# Patient Record
Sex: Female | Born: 1950 | Hispanic: Yes | Smoking: Former smoker
Health system: Southern US, Community
[De-identification: ages and names within clinical notes are randomized; demographics above are authoritative.]

## PROBLEM LIST (undated history)

## (undated) DIAGNOSIS — E119 Type 2 diabetes mellitus without complications: Secondary | ICD-10-CM

---

## 2016-05-12 ENCOUNTER — Emergency Department
Admission: EM | Admit: 2016-05-12 | Discharge: 2016-05-12 | Disposition: A | Discharge status: Home / Self Care | Payer: Medicare Other | Attending: Emergency Medicine | Admitting: Emergency Medicine

## 2016-05-12 ENCOUNTER — Encounter: Payer: Self-pay | Admitting: *Deleted

## 2016-05-12 DIAGNOSIS — E119 Type 2 diabetes mellitus without complications: Secondary | ICD-10-CM | POA: Diagnosis not present

## 2016-05-12 DIAGNOSIS — R1013 Epigastric pain: Secondary | ICD-10-CM | POA: Diagnosis not present

## 2016-05-12 DIAGNOSIS — F17210 Nicotine dependence, cigarettes, uncomplicated: Secondary | ICD-10-CM | POA: Diagnosis not present

## 2016-05-12 DIAGNOSIS — R110 Nausea: Secondary | ICD-10-CM

## 2016-05-12 DIAGNOSIS — R630 Anorexia: Secondary | ICD-10-CM

## 2016-05-12 DIAGNOSIS — N3000 Acute cystitis without hematuria: Secondary | ICD-10-CM | POA: Diagnosis not present

## 2016-05-12 HISTORY — DX: Type 2 diabetes mellitus without complications: E11.9

## 2016-05-12 LAB — COMPREHENSIVE METABOLIC PANEL
ALK PHOS: 119 U/L (ref 38–126)
ALT: 12 U/L — AB (ref 14–54)
ANION GAP: 9 (ref 5–15)
AST: 21 U/L (ref 15–41)
Albumin: 4 g/dL (ref 3.5–5.0)
BILIRUBIN TOTAL: 0.7 mg/dL (ref 0.3–1.2)
BUN: 15 mg/dL (ref 6–20)
CALCIUM: 9 mg/dL (ref 8.9–10.3)
CO2: 29 mmol/L (ref 22–32)
CREATININE: 0.56 mg/dL (ref 0.44–1.00)
Chloride: 98 mmol/L — ABNORMAL LOW (ref 101–111)
Glucose, Bld: 238 mg/dL — ABNORMAL HIGH (ref 65–99)
Potassium: 4.8 mmol/L (ref 3.5–5.1)
SODIUM: 136 mmol/L (ref 135–145)
TOTAL PROTEIN: 6.8 g/dL (ref 6.5–8.1)

## 2016-05-12 LAB — URINALYSIS, COMPLETE (UACMP) WITH MICROSCOPIC
Bilirubin Urine: NEGATIVE
GLUCOSE, UA: 150 mg/dL — AB
HGB URINE DIPSTICK: NEGATIVE
KETONES UR: 5 mg/dL — AB
LEUKOCYTES UA: NEGATIVE
NITRITE: POSITIVE — AB
PH: 5 (ref 5.0–8.0)
PROTEIN: NEGATIVE mg/dL
RBC / HPF: NONE SEEN RBC/hpf (ref 0–5)
Specific Gravity, Urine: 1.01 (ref 1.005–1.030)
Squamous Epithelial / LPF: NONE SEEN

## 2016-05-12 LAB — CBC
HCT: 41.7 % (ref 35.0–47.0)
HEMOGLOBIN: 14.7 g/dL (ref 12.0–16.0)
MCH: 33.3 pg (ref 26.0–34.0)
MCHC: 35.3 g/dL (ref 32.0–36.0)
MCV: 94.3 fL (ref 80.0–100.0)
PLATELETS: 183 10*3/uL (ref 150–440)
RBC: 4.42 MIL/uL (ref 3.80–5.20)
RDW: 13.3 % (ref 11.5–14.5)
WBC: 6.2 10*3/uL (ref 3.6–11.0)

## 2016-05-12 LAB — TROPONIN I: Troponin I: <0.03 ng/mL (ref <0.03)

## 2016-05-12 LAB — LIPASE, BLOOD: Lipase: 39 U/L (ref 11–51)

## 2016-05-12 LAB — TSH: TSH: 1.089 u[IU]/mL (ref 0.350–4.500)

## 2016-05-12 MED ORDER — ONDANSETRON 4 MG PO TBDP: 4.0000 mg | ORAL_TABLET | Freq: Three times a day (TID) | ORAL | 0 refills | Status: DC | PRN

## 2016-05-12 MED ORDER — CEPHALEXIN 500 MG PO CAPS: 500.0000 mg | ORAL_CAPSULE | Freq: Four times a day (QID) | ORAL | 0 refills | Status: AC

## 2016-05-12 MED ORDER — CEPHALEXIN 500 MG PO CAPS
500.0000 mg | ORAL_CAPSULE | Freq: Once | ORAL | Status: AC
  Administered 2016-05-12: 500 mg via ORAL
  Filled 2016-05-12 (×2): qty 1

## 2016-05-12 NOTE — ED Triage Notes (Signed)
Pt reports abdominal pain for 3 days , pt unable to tolerate food, pt tolerating fluids, interpreter used

## 2016-05-12 NOTE — ED Provider Notes (Signed)
[facility] Emergency Department Provider Note  ____________________________________________  Time seen: Approximately 7:19 PM  I have reviewed the triage vital signs and the nursing notes.   HISTORY  Chief Complaint Abdominal Pain    HPI Carrie Hernandez is a 65 y.o. female a history of diabetes presenting with generalized malaise, fatigue, anorexia, minimal epigastric discomfort, nausea without vomiting. The symptoms been going on for 3 days and have gotten progressively worse. The patient denies any fever, chills, cough or cold symptoms, chest pain, shortness of breath, dysuria.   Past Medical History:  Diagnosis Date  . Diabetes mellitus without complication (HCC)     There are no active problems to display for this patient.   History reviewed. No pertinent surgical history.    Allergies Patient has no known allergies.  No family history on file.  Social History Social History  Substance Use Topics  . Smoking status: Current Every Day Smoker    Packs/day: 0.50    Types: Cigarettes  . Smokeless tobacco: Not on file  . Alcohol use No    Review of Systems Constitutional: No fever/chills.Positive general malaise. Positive general fatigue. Eyes: No visual changes. ENT: No sore throat. No congestion or rhinorrhea. Cardiovascular: Denies chest pain. Denies palpitations. Respiratory: Denies shortness of breath.  No cough. Gastrointestinal: Mild epigastric abdominal pain.  Positive nausea, no vomiting.  Positive anorexia. No diarrhea.  No constipation. Genitourinary: Negative for dysuria. Musculoskeletal: Negative for back pain. Skin: Negative for rash. Neurological: Negative for headaches. No focal numbness, tingling or weakness.   10-point ROS otherwise negative.  ____________________________________________   PHYSICAL EXAM:  VITAL SIGNS: ED Triage Vitals  Enc Vitals Group     BP 05/12/16 1813 112/61     Pulse Rate 05/12/16  1813 94     Resp 05/12/16 1813 18     Temp 05/12/16 1813 98.7 F (37.1 C)     Temp Source 05/12/16 1813 Oral     SpO2 05/12/16 1813 99 %     Weight 05/12/16 1814 90 lb (40.8 kg)     Height 05/12/16 1814 5\' 2"  (1.575 m)     Head Circumference --      Peak Flow --      Pain Score --      Pain Loc --      Pain Edu? --      Excl. in GC? --     Constitutional: Alert and oriented. Chronically ill appearing but in no acute distress. Answers questions appropriately. Eyes: Conjunctivae are normal.  EOMI. No scleral icterus. Head: Atraumatic. Nose: No congestion/rhinnorhea. Mouth/Throat: Mucous membranes are moist.  Neck: No stridor.  Supple.  No JVD. No meningismus. Cardiovascular: Normal rate, regular rhythm. No murmurs, rubs or gallops.  Respiratory: Normal respiratory effort.  No accessory muscle use or retractions. Lungs CTAB.  No wheezes, rales or ronchi. Gastrointestinal: Soft, and nondistended.  Minimal tenderness to palpation in the epigastrium; patient reports it is almost imperceptible. No guarding or rebound.  No peritoneal signs. Musculoskeletal: No LE edema. No ttp in the calves or palpable cords.  Negative Homan's sign. Neurologic:  A&Ox3.  Speech is clear.  Face and smile are symmetric.  EOMI.  Moves all extremities well. Skin:  Skin is warm, dry and intact. No rash noted. Psychiatric: Mood and affect are normal. Speech and behavior are normal.  Normal judgement.  ____________________________________________   LABS (all labs ordered are listed, but only abnormal results are displayed)  Labs Reviewed  COMPREHENSIVE METABOLIC  PANEL - Abnormal; Notable for the following:       Result Value   Chloride 98 (*)    Glucose, Bld 238 (*)    ALT 12 (*)    All other components within normal limits  URINALYSIS, COMPLETE (UACMP) WITH MICROSCOPIC - Abnormal; Notable for the following:    Color, Urine YELLOW (*)    APPearance CLEAR (*)    Glucose, UA 150 (*)    Ketones, ur 5 (*)     Nitrite POSITIVE (*)    Bacteria, UA RARE (*)    All other components within normal limits  LIPASE, BLOOD  CBC  TSH  TROPONIN I   ____________________________________________  EKG  ED ECG REPORT I, Rockne MenghiniNorman, Anne-Caroline, the attending physician, personally viewed and interpreted this ECG.   Date: 05/12/2016  EKG Time: 1817  Rate: 89  Rhythm: normal sinus rhythm  Axis: leftward  Intervals:none  ST&T Change: Nonspecific T-wave inversion in V1. No ST elevation.  ____________________________________________  RADIOLOGY  No results found.  ____________________________________________   PROCEDURES  Procedure(s) performed: None  Procedures  Critical Care performed: No ____________________________________________   INITIAL IMPRESSION / ASSESSMENT AND PLAN / ED COURSE  Pertinent labs & imaging results that were available during my care of the patient were reviewed by me and considered in my medical decision making (see chart for details).  65 y.o. female with a history of diabetes presenting with general malaise, fatigue, anorexia, epigastric discomfort, and nausea. The patient has no focal findings on her physical examination. We'll get a urinalysis to rule out UTI. Her laboratory studies are reassuring although she does have hyperglycemia without DKA. Her EKG does not show ischemic changes, but she is high risk for atypical ACS or MI so we will also get a troponin. Plan to get orthostatics as well. Plan reevaluation for final disposition.  ----------------------------------------- 8:23 PM on 05/12/2016 -----------------------------------------  The patient has reassuring electrolytes with the exception of hyperglycemia. Her white blood cell count is normal and she is not anemic. Her TSH is normal and her troponin is negative. She does not have ischemic changes on her EKG. Her UA is nitrite positive without any other significant the large findings of UTI, but it is  possible that this is the cause of her symptoms. I will initiate her on Keflex, and discharge her home with close PMD follow-up.  ____________________________________________  FINAL CLINICAL IMPRESSION(S) / ED DIAGNOSES  Final diagnoses:  Acute cystitis without hematuria  Epigastric pain  Anorexia  Nausea    Clinical Course       NEW MEDICATIONS STARTED DURING THIS VISIT:  New Prescriptions   CEPHALEXIN (KEFLEX) 500 MG CAPSULE    Take 1 capsule (500 mg total) by mouth 4 (four) times daily.   ONDANSETRON (ZOFRAN ODT) 4 MG DISINTEGRATING TABLET    Take 1 tablet (4 mg total) by mouth every 8 (eight) hours as needed for nausea or vomiting.      Rockne MenghiniAnne-Caroline Deyra Perdomo, MD 05/12/16 2024

## 2016-05-12 NOTE — Discharge Instructions (Signed)
Please take the entire course of antibiotics, even if you're feeling better. Zofran as for nausea. Please return to the emergency department if you develop severe pain, fever, inability to keep down fluids, or any other symptoms concerning to you.

## 2017-12-18 DIAGNOSIS — R030 Elevated blood-pressure reading, without diagnosis of hypertension: Secondary | ICD-10-CM | POA: Insufficient documentation

## 2017-12-18 DIAGNOSIS — E119 Type 2 diabetes mellitus without complications: Secondary | ICD-10-CM | POA: Insufficient documentation

## 2019-10-02 ENCOUNTER — Other Ambulatory Visit: Payer: Self-pay

## 2019-10-02 ENCOUNTER — Emergency Department
Admission: EM | Admit: 2019-10-02 | Discharge: 2019-10-02 | Disposition: A | Discharge status: Home / Self Care | Payer: Medicare Other | Attending: Student | Admitting: Student

## 2019-10-02 DIAGNOSIS — Z794 Long term (current) use of insulin: Secondary | ICD-10-CM | POA: Diagnosis not present

## 2019-10-02 DIAGNOSIS — Z760 Encounter for issue of repeat prescription: Secondary | ICD-10-CM | POA: Diagnosis present

## 2019-10-02 DIAGNOSIS — E119 Type 2 diabetes mellitus without complications: Secondary | ICD-10-CM | POA: Diagnosis not present

## 2019-10-02 MED ORDER — INSULIN GLARGINE 100 UNIT/ML ~~LOC~~ SOLN: SUBCUTANEOUS | 3 refills | Status: AC

## 2019-10-02 NOTE — ED Notes (Addendum)
See triage note  States she is in need of getting her insulin filled  Denies any other problems

## 2019-10-02 NOTE — ED Triage Notes (Signed)
Pt comes via POV from home with c/o needing insulin refill. Pt states she is here visiting family and just need some insulin. Pt states she was not allowed to bring insulin with her on the trip.

## 2019-10-02 NOTE — ED Triage Notes (Signed)
First Nurse Note:  Arrives for insulin RX.  Per family, patient has been out of Insulin since Monday.  Per family, patient returned from [place] on Monday and her insulin was [place] from patient at the airport.  Patient's RX was for  Lantus 7 units q am.  Patient is AAOx3.  Skin warm and dry. NAD

## 2019-10-04 NOTE — ED Provider Notes (Signed)
[facility] Emergency Department Provider Note  ____________________________________________  Time seen: Approximately 10:28 PM  I have reviewed the triage vital signs and the nursing notes.   HISTORY  Chief Complaint insulin refill   HPI Carrie Hernandez is a 69 y.o. female presents to the emergency department requesting a prescription for insulin.  She states that she is here from [place] visiting her family as she was not allowed to bring insulin.  She has no current medical complaints  Past Medical History:  Diagnosis Date  . Diabetes mellitus without complication (HCC)     There are no problems to display for this patient.   History reviewed. No pertinent surgical history.  Prior to Admission medications   Medication Sig Start Date End Date Taking? Authorizing Provider  insulin glargine (LANTUS) 100 UNIT/ML injection 7 units before breakfast 10/02/19 10/01/20  Kem Boroughs B, FNP    Allergies Patient has no known allergies.  No family history on file.  Social History Social History   Tobacco Use  . Smoking status: Current Every Day Smoker    Packs/day: 0.50    Types: Cigarettes  Substance Use Topics  . Alcohol use: No  . Drug use: Not on file    Review of Systems Constitutional: Negative for fever. ENT: Negative for sore throat. Respiratory: Negative for cough Gastrointestinal: No abdominal pain.  No nausea, no vomiting.  No diarrhea.  Musculoskeletal: Negative for generalized body aches. Skin: Negative for rash/lesion/wound. Neurological: Negative for headaches, focal weakness or numbness.  ____________________________________________   PHYSICAL EXAM:  VITAL SIGNS: ED Triage Vitals [10/02/19 1351]  Enc Vitals Group     BP 130/62     Pulse Rate 79     Resp 18     Temp 98 F (36.7 C)     Temp src      SpO2 98 %     Weight 120 lb (54.4 kg)     Height 5' (1.524 m)     Head Circumference      Peak Flow      Pain  Score 0     Pain Loc      Pain Edu?      Excl. in GC?     Constitutional: Alert and oriented. Well appearing and in no acute distress. Eyes: Conjunctivae are normal. PERRL. EOMI. Head: Atraumatic. Nose: No congestion/rhinnorhea. Mouth/Throat: Mucous membranes are moist. Neck: No stridor.  Cardiovascular: Normal rate, regular rhythm. Good peripheral circulation. Respiratory: Normal respiratory effort. Musculoskeletal: Full ROM throughout.  Neurologic:  Normal speech and language. No gross focal neurologic deficits are appreciated. Speech is normal. No gait instability. Skin:  Skin is warm, dry and intact. No rash noted. Psychiatric: Mood and affect are normal. Speech and behavior are normal.  ____________________________________________   LABS (all labs ordered are listed, but only abnormal results are displayed)  Labs Reviewed - No data to display ____________________________________________  EKG  Not indicated ____________________________________________  RADIOLOGY  Not indicated ____________________________________________   PROCEDURES  Not indicated ____________________________________________   INITIAL IMPRESSION / ASSESSMENT AND PLAN / ED COURSE     Pertinent labs & imaging results that were available during my care of the patient were reviewed by me and considered in my medical decision making (see chart for details).  Insulin dosage was verified.  Interpreter was utilized.  Prescription sent to the pharmacy.  Patient was advised follow-up to the emergency department or return to the emergency department for symptoms of concern.. ____________________________________________   FINAL  CLINICAL IMPRESSION(S) / ED DIAGNOSES  Final diagnoses:  Encounter for medication refill       Victorino Dike, FNP 10/04/19 2230    Nance Pear, MD 10/06/19 [phone]

## 2020-01-22 DIAGNOSIS — R413 Other amnesia: Secondary | ICD-10-CM | POA: Insufficient documentation

## 2020-01-27 ENCOUNTER — Other Ambulatory Visit: Payer: Self-pay | Admitting: Acute Care

## 2020-01-27 DIAGNOSIS — I639 Cerebral infarction, unspecified: Secondary | ICD-10-CM

## 2020-02-13 ENCOUNTER — Ambulatory Visit: Admission: RE | Admit: 2020-02-13 | Payer: Medicare Other | Source: Ambulatory Visit

## 2020-04-02 ENCOUNTER — Other Ambulatory Visit: Payer: Self-pay

## 2020-04-02 ENCOUNTER — Ambulatory Visit
Admission: RE | Admit: 2020-04-02 | Discharge: 2020-04-02 | Disposition: A | Discharge status: Home / Self Care | Payer: Medicare Other | Source: Ambulatory Visit | Attending: Acute Care | Admitting: Acute Care

## 2020-04-02 DIAGNOSIS — I639 Cerebral infarction, unspecified: Secondary | ICD-10-CM | POA: Diagnosis present

## 2020-04-08 ENCOUNTER — Other Ambulatory Visit (HOSPITAL_COMMUNITY): Payer: Self-pay | Admitting: Acute Care

## 2020-04-08 ENCOUNTER — Other Ambulatory Visit: Payer: Self-pay | Admitting: Acute Care

## 2020-04-08 DIAGNOSIS — R9089 Other abnormal findings on diagnostic imaging of central nervous system: Secondary | ICD-10-CM

## 2020-04-23 ENCOUNTER — Ambulatory Visit: Admission: RE | Admit: 2020-04-23 | Payer: Medicare Other | Source: Ambulatory Visit

## 2021-01-04 ENCOUNTER — Other Ambulatory Visit (INDEPENDENT_AMBULATORY_CARE_PROVIDER_SITE_OTHER): Payer: Self-pay | Admitting: Nurse Practitioner

## 2021-01-04 DIAGNOSIS — R0989 Other specified symptoms and signs involving the circulatory and respiratory systems: Secondary | ICD-10-CM

## 2021-01-06 ENCOUNTER — Encounter (INDEPENDENT_AMBULATORY_CARE_PROVIDER_SITE_OTHER): Payer: Self-pay | Admitting: Nurse Practitioner

## 2021-01-06 ENCOUNTER — Ambulatory Visit (INDEPENDENT_AMBULATORY_CARE_PROVIDER_SITE_OTHER): Payer: Medicare Other | Admitting: Nurse Practitioner

## 2021-01-06 ENCOUNTER — Ambulatory Visit (INDEPENDENT_AMBULATORY_CARE_PROVIDER_SITE_OTHER): Payer: Medicare Other

## 2021-01-06 ENCOUNTER — Other Ambulatory Visit: Payer: Self-pay

## 2021-01-06 ENCOUNTER — Encounter (INDEPENDENT_AMBULATORY_CARE_PROVIDER_SITE_OTHER): Payer: Self-pay

## 2021-01-06 VITALS — BP 148/77 | HR 90 | Resp 16 | Wt 109.6 lb

## 2021-01-06 DIAGNOSIS — E119 Type 2 diabetes mellitus without complications: Secondary | ICD-10-CM | POA: Diagnosis not present

## 2021-01-06 DIAGNOSIS — R0989 Other specified symptoms and signs involving the circulatory and respiratory systems: Secondary | ICD-10-CM

## 2021-01-06 DIAGNOSIS — I70223 Atherosclerosis of native arteries of extremities with rest pain, bilateral legs: Secondary | ICD-10-CM | POA: Diagnosis not present

## 2021-01-06 NOTE — H&P (View-Only) (Signed)
Subjective:    Patient ID: Carrie Hernandez, female    DOB: 1950/10/20, 70 y.o.   MRN: [id number] Chief Complaint  Patient presents with  . New Patient (Initial Visit)    Ref [name] decreased pulse    Carrie Hernandez is a 70 year old female that presents today on referral by her primary care physician Dr. Letitia Libra with concern to decrease pedal pulses.  The patient also has ingrown toenail on her right foot that may require intervention however due to these decreased pulses they did not want to proceed with any intervention prior to evaluation.  The patient endorses having claudication-like symptoms.  She also complains of feeling like there is pain when she is walking underneath her feet.  She also describes a throbbing pain like sensation when she is in bed in her right foot, that are consistent with rest pain like symptoms.  She currently does not have any open wounds or ulcerations.  The patient does have a previous history of diabetes and she has been smoking as of recently.  Today noninvasive studies show an ABI of 0.0 on the left is 0.58 on the right.  The left TBI 0.42 with a TBI of 0.04 on the right.  The patient has monophasic/biphasic waveforms on the left tibial arteries with slightly dampened toe waveforms.  The right has monophasic waveforms with an absent right toe waveforms.   Review of Systems  Skin:  Negative for wound.  All other systems reviewed and are negative.     Objective:   Physical Exam Vitals reviewed.  HENT:     Head: Normocephalic.  Cardiovascular:     Rate and Rhythm: Normal rate.     Pulses:          Dorsalis pedis pulses are 0 on the right side and detected w/ Doppler on the left side.       Posterior tibial pulses are 0 on the right side and detected w/ Doppler on the left side.  Pulmonary:     Effort: Pulmonary effort is normal.  Skin:    General: Skin is dry.  Neurological:     Mental Status: She is alert and oriented to person, place, and  time.  Psychiatric:        Mood and Affect: Mood normal.        Behavior: Behavior normal.        Thought Content: Thought content normal.        Judgment: Judgment normal.    BP (!) 148/77 (BP Location: Left Arm)   Pulse 90   Resp 16   Wt 109 lb 9.6 oz (49.7 kg)   BMI 21.40 kg/m   Past Medical History:  Diagnosis Date  . Diabetes mellitus without complication [facility])     Social History   Socioeconomic History  . Marital status: Widowed    Spouse name: Not on file  . Number of children: Not on file  . Years of education: Not on file  . Highest education level: Not on file  Occupational History  . Not on file  Tobacco Use  . Smoking status: Former    Packs/day: 0.50    Types: Cigarettes  . Smokeless tobacco: Never  Substance and Sexual Activity  . Alcohol use: No  . Drug use: Not on file  . Sexual activity: Not on file  Other Topics Concern  . Not on file  Social History Narrative  . Not on file   Social Determinants of Health  Financial Resource Strain: Not on file  Food Insecurity: Not on file  Transportation Needs: Not on file  Physical Activity: Not on file  Stress: Not on file  Social Connections: Not on file  Intimate Partner Violence: Not on file    History reviewed. No pertinent surgical history.  Family History  Problem Relation Age of Onset  . Diabetes Mother   . Lung cancer Brother   . Heart attack Brother   . Diabetes Maternal Aunt   . Diabetes Maternal Uncle     No Known Allergies  CBC Latest Ref Rng & Units 05/12/2016  WBC 3.6 - 11.0 K/uL 6.2  Hemoglobin 12.0 - 16.0 g/dL 81.4  Hematocrit 48.1 - 47.0 % 41.7  Platelets 150 - 440 K/uL 183      CMP     Component Value Date/Time   NA 136 05/12/2016 1816   K 4.8 05/12/2016 1816   CL 98 (L) 05/12/2016 1816   CO2 29 05/12/2016 1816   GLUCOSE 238 (H) 05/12/2016 1816   BUN 15 05/12/2016 1816   CREATININE 0.56 05/12/2016 1816   CALCIUM 9.0 05/12/2016 1816   PROT 6.8 05/12/2016  1816   ALBUMIN 4.0 05/12/2016 1816   AST 21 05/12/2016 1816   ALT 12 (L) 05/12/2016 1816   ALKPHOS 119 05/12/2016 1816   BILITOT 0.7 05/12/2016 1816   GFRNONAA >60 05/12/2016 1816   GFRAA >60 05/12/2016 1816     No results found.     Assessment & Plan:   1. 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  Currently if the patient undergoes any procedures for an ingrown toenail on her right foot it would not heal due to profound decrease in perfusion.  Recommend:  The patient has evidence of severe atherosclerotic changes of the right lower extremity with rest pain that is associated with preulcerative changes and impending tissue loss of the foot.  This represents a limb threatening ischemia and places the patient at risk for limb loss.  Patient should undergo angiography of the lower extremities with the hope for intervention for limb salvage.  The risks and benefits as well as the alternative therapies was discussed in detail with the patient.  All questions were answered.  Patient agrees to proceed with angiography.  The patient will follow up with me in the office after the procedure.       2. Type 2 diabetes mellitus without complication, without long-term current use of insulin (HCC) Continue hypoglycemic medications as already ordered, these medications have been reviewed and there are no changes at this time.  Hgb A1C to be monitored as already arranged by primary service    Current Outpatient Medications on File Prior to Visit  Medication Sig Dispense Refill  . BD INSULIN SYRINGE U/F 31G X 5/16" 0.3 ML MISC See admin instructions.    Marland Kitchen glucose blood (PRECISION QID TEST) test strip 1 each (1 strip total) 3 (three) times daily To check blood sugars Dx: E11.9    . insulin glargine (LANTUS) 100 UNIT/ML injection 7 units  before breakfast 10 mL 3  . metFORMIN (GLUCOPHAGE) 500 MG tablet Take by mouth.    . traZODone (DESYREL) 50 MG tablet Take 50 mg by mouth at bedtime.     No current facility-administered medications on file prior to visit.    There are no  Patient Instructions on file for this visit. No follow-ups on file.   Georgiana Spinner, NP

## 2021-01-06 NOTE — Progress Notes (Signed)
Subjective:    Patient ID: Carrie Hernandez, female    DOB: 1950/10/20, 70 y.o.   MRN: [id number] Chief Complaint  Patient presents with  . New Patient (Initial Visit)    Ref [name] decreased pulse    Carrie Hernandez is a 70 year old female that presents today on referral by her primary care physician Dr. Letitia Libra with concern to decrease pedal pulses.  The patient also has ingrown toenail on her right foot that may require intervention however due to these decreased pulses they did not want to proceed with any intervention prior to evaluation.  The patient endorses having claudication-like symptoms.  She also complains of feeling like there is pain when she is walking underneath her feet.  She also describes a throbbing pain like sensation when she is in bed in her right foot, that are consistent with rest pain like symptoms.  She currently does not have any open wounds or ulcerations.  The patient does have a previous history of diabetes and she has been smoking as of recently.  Today noninvasive studies show an ABI of 0.0 on the left is 0.58 on the right.  The left TBI 0.42 with a TBI of 0.04 on the right.  The patient has monophasic/biphasic waveforms on the left tibial arteries with slightly dampened toe waveforms.  The right has monophasic waveforms with an absent right toe waveforms.   Review of Systems  Skin:  Negative for wound.  All other systems reviewed and are negative.     Objective:   Physical Exam Vitals reviewed.  HENT:     Head: Normocephalic.  Cardiovascular:     Rate and Rhythm: Normal rate.     Pulses:          Dorsalis pedis pulses are 0 on the right side and detected w/ Doppler on the left side.       Posterior tibial pulses are 0 on the right side and detected w/ Doppler on the left side.  Pulmonary:     Effort: Pulmonary effort is normal.  Skin:    General: Skin is dry.  Neurological:     Mental Status: She is alert and oriented to person, place, and  time.  Psychiatric:        Mood and Affect: Mood normal.        Behavior: Behavior normal.        Thought Content: Thought content normal.        Judgment: Judgment normal.    BP (!) 148/77 (BP Location: Left Arm)   Pulse 90   Resp 16   Wt 109 lb 9.6 oz (49.7 kg)   BMI 21.40 kg/m   Past Medical History:  Diagnosis Date  . Diabetes mellitus without complication [facility])     Social History   Socioeconomic History  . Marital status: Widowed    Spouse name: Not on file  . Number of children: Not on file  . Years of education: Not on file  . Highest education level: Not on file  Occupational History  . Not on file  Tobacco Use  . Smoking status: Former    Packs/day: 0.50    Types: Cigarettes  . Smokeless tobacco: Never  Substance and Sexual Activity  . Alcohol use: No  . Drug use: Not on file  . Sexual activity: Not on file  Other Topics Concern  . Not on file  Social History Narrative  . Not on file   Social Determinants of Health  Financial Resource Strain: Not on file  Food Insecurity: Not on file  Transportation Needs: Not on file  Physical Activity: Not on file  Stress: Not on file  Social Connections: Not on file  Intimate Partner Violence: Not on file    History reviewed. No pertinent surgical history.  Family History  Problem Relation Age of Onset  . Diabetes Mother   . Lung cancer Brother   . Heart attack Brother   . Diabetes Maternal Aunt   . Diabetes Maternal Uncle     No Known Allergies  CBC Latest Ref Rng & Units 05/12/2016  WBC 3.6 - 11.0 K/uL 6.2  Hemoglobin 12.0 - 16.0 g/dL 81.4  Hematocrit 48.1 - 47.0 % 41.7  Platelets 150 - 440 K/uL 183      CMP     Component Value Date/Time   NA 136 05/12/2016 1816   K 4.8 05/12/2016 1816   CL 98 (L) 05/12/2016 1816   CO2 29 05/12/2016 1816   GLUCOSE 238 (H) 05/12/2016 1816   BUN 15 05/12/2016 1816   CREATININE 0.56 05/12/2016 1816   CALCIUM 9.0 05/12/2016 1816   PROT 6.8 05/12/2016  1816   ALBUMIN 4.0 05/12/2016 1816   AST 21 05/12/2016 1816   ALT 12 (L) 05/12/2016 1816   ALKPHOS 119 05/12/2016 1816   BILITOT 0.7 05/12/2016 1816   GFRNONAA >60 05/12/2016 1816   GFRAA >60 05/12/2016 1816     No results found.     Assessment & Plan:   1. 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  Currently if the patient undergoes any procedures for an ingrown toenail on her right foot it would not heal due to profound decrease in perfusion.  Recommend:  The patient has evidence of severe atherosclerotic changes of the right lower extremity with rest pain that is associated with preulcerative changes and impending tissue loss of the foot.  This represents a limb threatening ischemia and places the patient at risk for limb loss.  Patient should undergo angiography of the lower extremities with the hope for intervention for limb salvage.  The risks and benefits as well as the alternative therapies was discussed in detail with the patient.  All questions were answered.  Patient agrees to proceed with angiography.  The patient will follow up with me in the office after the procedure.       2. Type 2 diabetes mellitus without complication, without long-term current use of insulin (HCC) Continue hypoglycemic medications as already ordered, these medications have been reviewed and there are no changes at this time.  Hgb A1C to be monitored as already arranged by primary service    Current Outpatient Medications on File Prior to Visit  Medication Sig Dispense Refill  . BD INSULIN SYRINGE U/F 31G X 5/16" 0.3 ML MISC See admin instructions.    Marland Kitchen glucose blood (PRECISION QID TEST) test strip 1 each (1 strip total) 3 (three) times daily To check blood sugars Dx: E11.9    . insulin glargine (LANTUS) 100 UNIT/ML injection 7 units  before breakfast 10 mL 3  . metFORMIN (GLUCOPHAGE) 500 MG tablet Take by mouth.    . traZODone (DESYREL) 50 MG tablet Take 50 mg by mouth at bedtime.     No current facility-administered medications on file prior to visit.    There are no  Patient Instructions on file for this visit. No follow-ups on file.   Georgiana Spinner, NP

## 2021-01-11 ENCOUNTER — Ambulatory Visit
Admission: RE | Admit: 2021-01-11 | Discharge: 2021-01-11 | Disposition: A | Discharge status: Home / Self Care | Payer: Medicare Other | Attending: Vascular Surgery | Admitting: Vascular Surgery

## 2021-01-11 ENCOUNTER — Other Ambulatory Visit (INDEPENDENT_AMBULATORY_CARE_PROVIDER_SITE_OTHER): Payer: Self-pay | Admitting: Nurse Practitioner

## 2021-01-11 ENCOUNTER — Encounter
Admission: RE | Disposition: A | Discharge status: Home / Self Care | Payer: Self-pay | Source: Home / Self Care | Attending: Vascular Surgery

## 2021-01-11 DIAGNOSIS — I70223 Atherosclerosis of native arteries of extremities with rest pain, bilateral legs: Secondary | ICD-10-CM | POA: Diagnosis not present

## 2021-01-11 DIAGNOSIS — I70221 Atherosclerosis of native arteries of extremities with rest pain, right leg: Secondary | ICD-10-CM | POA: Insufficient documentation

## 2021-01-11 DIAGNOSIS — Z7984 Long term (current) use of oral hypoglycemic drugs: Secondary | ICD-10-CM | POA: Insufficient documentation

## 2021-01-11 DIAGNOSIS — Z794 Long term (current) use of insulin: Secondary | ICD-10-CM | POA: Insufficient documentation

## 2021-01-11 DIAGNOSIS — Z833 Family history of diabetes mellitus: Secondary | ICD-10-CM | POA: Insufficient documentation

## 2021-01-11 DIAGNOSIS — I70229 Atherosclerosis of native arteries of extremities with rest pain, unspecified extremity: Secondary | ICD-10-CM

## 2021-01-11 DIAGNOSIS — E1151 Type 2 diabetes mellitus with diabetic peripheral angiopathy without gangrene: Secondary | ICD-10-CM | POA: Diagnosis present

## 2021-01-11 DIAGNOSIS — Z87891 Personal history of nicotine dependence: Secondary | ICD-10-CM | POA: Insufficient documentation

## 2021-01-11 DIAGNOSIS — L600 Ingrowing nail: Secondary | ICD-10-CM | POA: Insufficient documentation

## 2021-01-11 HISTORY — PX: LOWER EXTREMITY ANGIOGRAPHY: CATH118251

## 2021-01-11 LAB — CREATININE, SERUM
Creatinine, Ser: 0.65 mg/dL (ref 0.44–1.00)
GFR, Estimated: >60 mL/min (ref >60)

## 2021-01-11 LAB — GLUCOSE, CAPILLARY
Glucose-Capillary: 268 mg/dL — ABNORMAL HIGH (ref 70–99)
Glucose-Capillary: 82 mg/dL (ref 70–99)
Glucose-Capillary: 130 mg/dL — ABNORMAL HIGH (ref 70–99)

## 2021-01-11 LAB — BUN: BUN: 14 mg/dL (ref 8–23)

## 2021-01-11 SURGERY — LOWER EXTREMITY ANGIOGRAPHY
Anesthesia: Moderate Sedation | Site: Leg Lower | Laterality: Right

## 2021-01-11 MED ORDER — METHYLPREDNISOLONE SODIUM SUCC 125 MG IJ SOLR: 125.0000 mg | Freq: Once | INTRAMUSCULAR | Status: DC | PRN

## 2021-01-11 MED ORDER — CEFAZOLIN SODIUM-DEXTROSE 2-4 GM/100ML-% IV SOLN
2.0000 g | Freq: Once | INTRAVENOUS | Status: AC
  Administered 2021-01-11: 2 g via INTRAVENOUS

## 2021-01-11 MED ORDER — DEXTROSE-NACL 5-0.45 % IV SOLN: INTRAVENOUS | Status: DC

## 2021-01-11 MED ORDER — CLOPIDOGREL BISULFATE 75 MG PO TABS: 75.0000 mg | ORAL_TABLET | Freq: Every day | ORAL | 11 refills | Status: DC

## 2021-01-11 MED ORDER — SODIUM CHLORIDE 0.9 % IV SOLN: INTRAVENOUS | Status: DC

## 2021-01-11 MED ORDER — LABETALOL HCL 5 MG/ML IV SOLN: 10.0000 mg | INTRAVENOUS | Status: DC | PRN

## 2021-01-11 MED ORDER — SODIUM CHLORIDE 0.9 % IV SOLN: 250.0000 mL | INTRAVENOUS | Status: DC | PRN

## 2021-01-11 MED ORDER — ATORVASTATIN CALCIUM 10 MG PO TABS: 10.0000 mg | ORAL_TABLET | Freq: Every day | ORAL | 11 refills | Status: AC

## 2021-01-11 MED ORDER — CLOPIDOGREL BISULFATE 75 MG PO TABS: 75.0000 mg | ORAL_TABLET | Freq: Every day | ORAL | Status: DC

## 2021-01-11 MED ORDER — ONDANSETRON HCL 4 MG/2ML IJ SOLN: 4.0000 mg | Freq: Four times a day (QID) | INTRAMUSCULAR | Status: DC | PRN

## 2021-01-11 MED ORDER — HEPARIN SODIUM (PORCINE) 1000 UNIT/ML IJ SOLN
INTRAMUSCULAR | Status: DC | PRN
  Administered 2021-01-11: 4000 [IU] via INTRAVENOUS

## 2021-01-11 MED ORDER — IODIXANOL 320 MG/ML IV SOLN
INTRAVENOUS | Status: DC | PRN
  Administered 2021-01-11: 65 mL

## 2021-01-11 MED ORDER — SODIUM CHLORIDE 0.9% FLUSH: 3.0000 mL | Freq: Two times a day (BID) | INTRAVENOUS | Status: DC

## 2021-01-11 MED ORDER — MIDAZOLAM HCL 5 MG/5ML IJ SOLN
INTRAMUSCULAR | Status: AC
  Filled 2021-01-11: qty 5

## 2021-01-11 MED ORDER — MIDAZOLAM HCL 2 MG/2ML IJ SOLN
INTRAMUSCULAR | Status: DC | PRN
  Administered 2021-01-11: 1 mg via INTRAVENOUS
  Administered 2021-01-11: 2 mg via INTRAVENOUS

## 2021-01-11 MED ORDER — HYDROMORPHONE HCL 1 MG/ML IJ SOLN: 1.0000 mg | Freq: Once | INTRAMUSCULAR | Status: DC | PRN

## 2021-01-11 MED ORDER — MIDAZOLAM HCL 2 MG/ML PO SYRP: 8.0000 mg | ORAL_SOLUTION | Freq: Once | ORAL | Status: DC | PRN

## 2021-01-11 MED ORDER — ASPIRIN EC 81 MG PO TBEC
81.0000 mg | DELAYED_RELEASE_TABLET | Freq: Every day | ORAL | Status: DC
  Administered 2021-01-11: 81 mg via ORAL

## 2021-01-11 MED ORDER — ACETAMINOPHEN 325 MG PO TABS: 650.0000 mg | ORAL_TABLET | ORAL | Status: DC | PRN

## 2021-01-11 MED ORDER — HEPARIN SODIUM (PORCINE) 1000 UNIT/ML IJ SOLN
INTRAMUSCULAR | Status: AC
  Filled 2021-01-11: qty 1

## 2021-01-11 MED ORDER — ATORVASTATIN CALCIUM 10 MG PO TABS
10.0000 mg | ORAL_TABLET | Freq: Every day | ORAL | Status: DC
  Filled 2021-01-11 (×2): qty 1

## 2021-01-11 MED ORDER — SODIUM CHLORIDE 0.9% FLUSH: 3.0000 mL | INTRAVENOUS | Status: DC | PRN

## 2021-01-11 MED ORDER — FENTANYL CITRATE (PF) 100 MCG/2ML IJ SOLN
INTRAMUSCULAR | Status: DC | PRN
  Administered 2021-01-11: 50 ug via INTRAVENOUS
  Administered 2021-01-11: 25 ug via INTRAVENOUS

## 2021-01-11 MED ORDER — ASPIRIN EC 81 MG PO TBEC
DELAYED_RELEASE_TABLET | ORAL | Status: AC
  Filled 2021-01-11: qty 1

## 2021-01-11 MED ORDER — FENTANYL CITRATE (PF) 100 MCG/2ML IJ SOLN
INTRAMUSCULAR | Status: AC
  Filled 2021-01-11: qty 2

## 2021-01-11 MED ORDER — FAMOTIDINE 20 MG PO TABS: 40.0000 mg | ORAL_TABLET | Freq: Once | ORAL | Status: DC | PRN

## 2021-01-11 MED ORDER — DIPHENHYDRAMINE HCL 50 MG/ML IJ SOLN: 50.0000 mg | Freq: Once | INTRAMUSCULAR | Status: DC | PRN

## 2021-01-11 MED ORDER — HYDRALAZINE HCL 20 MG/ML IJ SOLN: 5.0000 mg | INTRAMUSCULAR | Status: DC | PRN

## 2021-01-11 MED ORDER — ASPIRIN EC 81 MG PO TBEC: 81.0000 mg | DELAYED_RELEASE_TABLET | Freq: Every day | ORAL | 2 refills | Status: AC

## 2021-01-11 SURGICAL SUPPLY — 27 items
BALLN LUTONIX 018 4X100X130 (BALLOONS) ×2
BALLN LUTONIX 018 4X150X130 (BALLOONS) ×2
BALLN LUTONIX 018 4X220X130 (BALLOONS) ×2
BALLN ULTRV 018 3X100X75 (BALLOONS)
BALLN ULTRVRSE 2X220X150 (BALLOONS) ×2
BALLN ULTRVRSE 3X100X150 (BALLOONS) ×2
BALLOON LUTONIX 018 4X100X130 (BALLOONS) ×1 IMPLANT
BALLOON LUTONIX 018 4X150X130 (BALLOONS) ×1 IMPLANT
BALLOON LUTONIX 018 4X220X130 (BALLOONS) ×1 IMPLANT
BALLOON ULTRV 018 3X100X75 (BALLOONS) IMPLANT
BALLOON ULTRVRSE 2X220X150 (BALLOONS) ×1 IMPLANT
BALLOON ULTRVRSE 3X100X150 (BALLOONS) ×1 IMPLANT
CATH PIG 70CM (CATHETERS) ×2 IMPLANT
CATH VERT 5X100 (CATHETERS) ×2 IMPLANT
COVER PROBE U/S 5X48 (MISCELLANEOUS) ×2 IMPLANT
DEVICE STARCLOSE SE CLOSURE (Vascular Products) ×2 IMPLANT
GLIDEWIRE ADV .035X260CM (WIRE) ×2 IMPLANT
KIT ENCORE 26 ADVANTAGE (KITS) ×2 IMPLANT
PACK ANGIOGRAPHY (CUSTOM PROCEDURE TRAY) ×2 IMPLANT
SHEATH ANL2 6FRX45 HC (SHEATH) ×2 IMPLANT
SHEATH BRITE TIP 5FRX11 (SHEATH) ×2 IMPLANT
STENT VIABAHN 5X100X120 (Permanent Stent) ×2 IMPLANT
STENT VIABAHN 5X150X120 (Permanent Stent) ×2 IMPLANT
SYR MEDRAD MARK 7 150ML (SYRINGE) ×2 IMPLANT
TUBING CONTRAST HIGH PRESS 72 (TUBING) ×2 IMPLANT
WIRE G V18X300CM (WIRE) ×2 IMPLANT
WIRE GUIDERIGHT .035X150 (WIRE) ×2 IMPLANT

## 2021-01-11 NOTE — Progress Notes (Signed)
Pt. BG: 82 upon readmission to [facility]. Pt. Has remained NPO since 0700. Pt. And daughter Hollie Salk state "she becomes sweaty and weak if she doesn't eat every 2 hrs." Dr. Wyn Quaker made aware: New IV ordered for procedure. Pt. Clammy. VSS. Pt. States she is "warm" per interpretor. Stable for procedure.

## 2021-01-11 NOTE — Interval H&P Note (Signed)
History and Physical Interval Note:  01/11/2021 1:05 PM  Carrie Hernandez  has presented today for surgery, with the diagnosis of RLE Angio    ASO w rest pain  Spanish Interpreter Requested.  The various methods of treatment have been discussed with the patient and family. After consideration of risks, benefits and other options for treatment, the patient has consented to  Procedure(s): LOWER EXTREMITY ANGIOGRAPHY (Right) as a surgical intervention.  The patient's history has been reviewed, patient examined, no change in status, stable for surgery.  I have reviewed the patient's chart and labs.  Questions were answered to the patient's satisfaction.     Festus Barren

## 2021-01-11 NOTE — Op Note (Signed)
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  Ultrasound guidance for vascular access left femoral artery             2.  Catheter placement into right common femoral artery from left femoral approach             3.  Aortogram and selective right lower extremity angiogram             4.  Percutaneous transluminal angioplasty of right posterior tibial artery with 2 mm diameter angioplasty balloon and of the tibioperoneal trunk with 3 mm diameter angioplasty balloon             5.  Percutaneous transluminal angioplasty of both the proximal right SFA as well as the distal right SFA and above-knee popliteal arteries with 4 mm diameter Lutonix drug-coated angioplasty balloons  6.  Stent placement to the right popliteal artery most distal SFA with 5 mm diameter by 15 cm length Viabahn stent  7.  Stent placement to the right proximal SFA with 5 mm diameter by 10 cm length Viabahn stent             8.  StarClose closure device left femoral artery  EBL: 10 cc  Contrast: 65 cc  Fluoro Time: 7.5 minutes  Moderate Conscious Sedation Time: approximately 63 minutes using 3 mg of Versed and 75 mcg of Fentanyl              Indications:  Patient is a 70 y.o.female with rest pain symptoms in both lower extremities. The patient has noninvasive study showing markedly reduced ABIs bilaterally. The patient is brought in for angiography for further evaluation and potential treatment.  Due to the limb threatening nature of the situation, angiogram was performed for attempted limb salvage. The patient is aware that if the procedure fails, amputation would be expected.  The patient also understands that even with successful revascularization, amputation may  still be required due to the severity of the situation.  Risks and benefits are discussed and informed consent is obtained.   Procedure:  The patient was identified and appropriate procedural time out was performed.  The patient was then placed supine on the table and prepped and draped in the usual sterile fashion. Moderate conscious sedation was administered during a face to face encounter with the patient throughout the procedure with my supervision of the RN administering medicines and monitoring the patient's vital signs, pulse oximetry, telemetry and mental status throughout from the start of the procedure until the patient was taken to the recovery room. Ultrasound was used to evaluate the left common femoral artery.  It was patent .  A digital ultrasound image was acquired.  A Seldinger needle was used to access the left common femoral artery under direct ultrasound guidance and a permanent image was performed.  A 0.035 J wire was advanced without resistance and a 5Fr sheath was placed.  Pigtail catheter was placed into the aorta and an AP aortogram was performed. This demonstrated normal renal arteries.  The aorta was calcific but not stenotic.  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  This was near the origin of the common iliac artery on the  left and was in the 70 to 75% range.  Both external iliac arteries appear to be widely patent.  I then crossed the aortic bifurcation and advanced to the right femoral head. Selective right lower extremity angiogram was then performed. This demonstrated 2 areas of stenosis in the proximal right superficial femoral artery in the 70 to 80% range.  The vessel then normalized down to Hunter's canal where the above-knee popliteal artery occluded and there was occlusion of all 3 tibial vessels with reconstitution of both the anterior tibial and the posterior tibial arteries in the proximal to mid segment.  These were then  continuous distally. It was felt that it was in the patient's best interest to proceed with intervention after these images to avoid a second procedure and a larger amount of contrast and fluoroscopy based off of the findings from the initial angiogram. The patient was systemically heparinized and a 6 [place] Ansell sheath was then placed over the [employer] wire. I then used a Kumpe catheter and the advantage wire to navigate down through the SFA stenosis and then across the popliteal occlusion and proximal posterior tibial artery occlusion getting down into the proximal to mid posterior tibial artery and confirming intraluminal flow.  I then exchanged for a V 18 wire.  A 2 mm diameter by 22 cm length angioplasty balloon was then inflated from the mid posterior tibial artery up to the tibioperoneal trunk.  A 3 mm diameter by 10 cm length angioplasty balloon was then inflated in the distal popliteal artery and tibioperoneal trunk and taken up to 6 atm for 1 minute.  A 4 mm diameter by 22 cm length Lutonix drug-coated angioplasty balloon was then used to treat the popliteal artery in the distal SFA inflating this to 8 atm for 1 minute and a 4 mm diameter by 10 cm length Lutonix drug-coated angioplasty balloon was inflated to 8 atm for 1 minute in the proximal SFA.  The posterior tibial artery and tibioperoneal trunk had less than 25% residual stenosis.  There is greater than 50% residual stenosis both in the popliteal artery and the proximal SFA that were addressed with stents.  A 5 mm diameter by 15 cm length Viabahn stent was deployed in the popliteal artery most distal SFA and a 5 mm diameter by 10 cm length stent was deployed in the proximal SFA.  These were postdilated with 4 mm balloons with excellent angiographic ablation result and less than 10% residual stenosis.  The left common iliac lesion was going to require kissing stents and I did not want to do this prior to her intervention on the left leg  which would make this for more difficult, so I elected to defer treatment on this until another day after her left infrainguinal disease was addressed. I elected to terminate the procedure. The sheath was removed and StarClose closure device was deployed in the right femoral artery with excellent hemostatic result. The patient was taken to the recovery room in stable condition having tolerated the procedure well.  Findings:               Aortogram: Renal arteries appeared widely patent.  The aorta was calcific but not stenotic.  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  This was near the origin of the common iliac artery on the left and was in the 70 to 75% range.  Both external iliac arteries appear to be widely patent.  Right lower Extremity:  This demonstrated 2 areas of stenosis in the proximal right superficial femoral artery in the 70 to 80% range.  The vessel then normalized down to Hunter's canal where the above-knee popliteal artery occluded and there was occlusion of all 3 tibial vessels with reconstitution of both the anterior tibial and the posterior tibial arteries in the proximal to mid segment.  These were then continuous distally.   Disposition: Patient was taken to the recovery room in stable condition having tolerated the procedure well.  Complications: None  Leotis Pain 01/11/2021 4:00 PM   This note was created with Dragon Medical transcription system. Any errors in dictation are purely unintentional.

## 2021-01-11 NOTE — Progress Notes (Signed)
Pt. Arrived late for procedure : pt. Interpretor Orson Slick here for [place] translation. Pt. States she had coffee with splenda & milk and a plain piece of toast at 7 AM as she was worried "about her sugar dropping." BG now: 268. Spoke with Dr. Wyn Quaker. Pt. To remain NPO and  come back at 2 PM to do procedure. Pt. Able to walk self out with relative. Stable for DC home at present.

## 2021-01-12 ENCOUNTER — Encounter: Payer: Self-pay | Admitting: Vascular Surgery

## 2021-01-18 ENCOUNTER — Telehealth (INDEPENDENT_AMBULATORY_CARE_PROVIDER_SITE_OTHER): Payer: Self-pay | Admitting: Nurse Practitioner

## 2021-01-18 NOTE — Telephone Encounter (Signed)
Patient is stating she is having pain with right ankle. Patient did have lower extre. Angio on 01/11/2021.  Patient is noticing pain in the area with warmness to the touch, she also states red dots are popping up in the area.  She is wanting to know if she can be seen sooner then Sept appointment.  Please advise  She would like her daughter to be called: Myra [phone]

## 2021-01-18 NOTE — Telephone Encounter (Signed)
The patient is likely experiencing some reperfusion pain and symptoms that happen after angiogram.  This is not unusual.  Patients can experience pain, swelling, and warmth following the procedure. The red dots are a side effect of the swelling. What is worrisome to [place] is if her foot becomes cold or extremely painful. She should use compression socks and elevate her leg and take tylenol for the pain.  She should try these interventions before we bring her in. If the patient has worsening swelling or pain, we can move her up at that time

## 2021-01-19 NOTE — Telephone Encounter (Signed)
Left message on patient daughter voicemail to return call

## 2021-01-19 NOTE — Telephone Encounter (Signed)
Lvm on daughter Carrie Hernandez's vm to callback at her earliest convenience to make her aware of Np's note.  This note is for documentation purposes only.

## 2021-01-21 NOTE — Telephone Encounter (Signed)
VM was left on nurses line by interpreter that patient reached out to them to let us know that she is having issues with her leg. Patient had le angio done 01/11/21. I made patients granddaughter aware that symptoms patient is having are normal per NP's not and if symptoms worsen to give [place] a call to have patient evaluated. Granddaughter made aware and will relay to patient

## 2021-01-27 DIAGNOSIS — I70229 Atherosclerosis of native arteries of extremities with rest pain, unspecified extremity: Secondary | ICD-10-CM | POA: Insufficient documentation

## 2021-02-09 ENCOUNTER — Other Ambulatory Visit (INDEPENDENT_AMBULATORY_CARE_PROVIDER_SITE_OTHER): Payer: Self-pay | Admitting: Vascular Surgery

## 2021-02-09 DIAGNOSIS — I70223 Atherosclerosis of native arteries of extremities with rest pain, bilateral legs: Secondary | ICD-10-CM

## 2021-02-09 DIAGNOSIS — Z95820 Peripheral vascular angioplasty status with implants and grafts: Secondary | ICD-10-CM

## 2021-02-10 ENCOUNTER — Encounter (INDEPENDENT_AMBULATORY_CARE_PROVIDER_SITE_OTHER): Payer: Self-pay | Admitting: Nurse Practitioner

## 2021-02-10 ENCOUNTER — Other Ambulatory Visit: Payer: Self-pay

## 2021-02-10 ENCOUNTER — Ambulatory Visit (INDEPENDENT_AMBULATORY_CARE_PROVIDER_SITE_OTHER): Payer: Medicare Other

## 2021-02-10 ENCOUNTER — Ambulatory Visit (INDEPENDENT_AMBULATORY_CARE_PROVIDER_SITE_OTHER): Payer: Medicare Other | Admitting: Nurse Practitioner

## 2021-02-10 VITALS — BP 145/73 | HR 86 | Resp 16 | Wt 111.0 lb

## 2021-02-10 DIAGNOSIS — E119 Type 2 diabetes mellitus without complications: Secondary | ICD-10-CM | POA: Diagnosis not present

## 2021-02-10 DIAGNOSIS — I70223 Atherosclerosis of native arteries of extremities with rest pain, bilateral legs: Secondary | ICD-10-CM

## 2021-02-10 DIAGNOSIS — Z95820 Peripheral vascular angioplasty status with implants and grafts: Secondary | ICD-10-CM | POA: Diagnosis not present

## 2021-02-15 ENCOUNTER — Encounter (INDEPENDENT_AMBULATORY_CARE_PROVIDER_SITE_OTHER): Payer: Self-pay | Admitting: Nurse Practitioner

## 2021-02-15 NOTE — Progress Notes (Signed)
Subjective:    Patient ID: Carrie Hernandez, female    DOB: [birth year], 70 y.o.   MRN: [id number] Chief Complaint  Patient presents with   Follow-up    [facility] 1 month le angio    Lalitha Ilyas is a 70 year old female that returns to the office for followup and review status post angiogram with intervention. The patient notes improvement in the lower extremity symptoms. No interval shortening of the patient's claudication distance or rest pain symptoms. No new ulcers or wounds have occurred since the last visit.  Patient had reperfusion edema but this is resolved at this time.  There have been no significant changes to the patient's overall health care.  The patient denies amaurosis fugax or recent TIA symptoms. There are no recent neurological changes noted. The patient denies history of DVT, PE or superficial thrombophlebitis. The patient denies recent episodes of angina or shortness of breath.   ABI's Rt=1.25 and Lt=0.84  (previous ABI's Rt=0.58 and Lt=0.80) Duplex US of the bilateral tibial arteries reveals biphasic waveforms with strong right first digit waveforms and slightly dampened left first digit waveforms.   Review of Systems  Cardiovascular:  Positive for leg swelling.  Skin:  Negative for wound.  All other systems reviewed and are negative.     Objective:   Physical Exam Vitals reviewed.  HENT:     Head: Normocephalic.  Cardiovascular:     Rate and Rhythm: Normal rate.     Pulses:          Dorsalis pedis pulses are 1+ on the right side and 1+ on the left side.       Posterior tibial pulses are 1+ on the right side.  Pulmonary:     Effort: Pulmonary effort is normal.  Musculoskeletal:     Right lower leg: No edema.     Left lower leg: No edema.  Neurological:     Mental Status: She is alert and oriented to person, place, and time.  Psychiatric:        Mood and Affect: Mood normal.        Behavior: Behavior normal.        Thought Content: Thought content  normal.        Judgment: Judgment normal.    BP (!) 145/73 (BP Location: Left Arm)   Pulse 86   Resp 16   Wt 111 lb (50.3 kg)   BMI 20.97 kg/m   Past Medical History:  Diagnosis Date   Diabetes mellitus without complication (HCC)     Social History   Socioeconomic History   Marital status: Widowed    Spouse name: Not on file   Number of children: Not on file   Years of education: Not on file   Highest education level: Not on file  Occupational History   Not on file  Tobacco Use   Smoking status: Former    Packs/day: 0.50    Types: Cigarettes   Smokeless tobacco: Never  Substance and Sexual Activity   Alcohol use: No   Drug use: Not on file   Sexual activity: Not on file  Other Topics Concern   Not on file  Social History Narrative   Not on file   Social Determinants of Health   Financial Resource Strain: Not on file  Food Insecurity: Not on file  Transportation Needs: Not on file  Physical Activity: Not on file  Stress: Not on file  Social Connections: Not on file  Intimate Partner Violence:  Not on file    Past Surgical History:  Procedure Laterality Date   LOWER EXTREMITY ANGIOGRAPHY Right 01/11/2021   Procedure: LOWER EXTREMITY ANGIOGRAPHY;  Surgeon: Annice Needy, MD;  Location: [facility];  Service: Cardiovascular;  Laterality: Right;    Family History  Problem Relation Age of Onset   Diabetes Mother    Lung cancer Brother    Heart attack Brother    Diabetes Maternal Aunt    Diabetes Maternal Uncle     No Known Allergies  CBC Latest Ref Rng & Units 05/12/2016  WBC 3.6 - 11.0 K/uL 6.2  Hemoglobin 12.0 - 16.0 g/dL 32.9  Hematocrit 92.4 - 47.0 % 41.7  Platelets 150 - 440 K/uL 183      CMP     Component Value Date/Time   NA 136 05/12/2016 1816   K 4.8 05/12/2016 1816   CL 98 (L) 05/12/2016 1816   CO2 29 05/12/2016 1816   GLUCOSE 238 (H) 05/12/2016 1816   BUN 14 01/11/2021 1425   CREATININE 0.65 01/11/2021 1425   CALCIUM 9.0  05/12/2016 1816   PROT 6.8 05/12/2016 1816   ALBUMIN 4.0 05/12/2016 1816   AST 21 05/12/2016 1816   ALT 12 (L) 05/12/2016 1816   ALKPHOS 119 05/12/2016 1816   BILITOT 0.7 05/12/2016 1816   GFRNONAA >60 01/11/2021 1425   GFRAA >60 05/12/2016 1816     VAS [place] ABI WITH/WO TBI  Result Date: 01/18/2021  LOWER EXTREMITY DOPPLER STUDY Patient Name:  Carrie Hernandez  Date of Exam:   01/06/2021 Medical Rec #: [id number]          Accession #:    [id number] Date of Birth: 10-06-1950         Patient Gender: F Patient Age:   70Y Exam Location:  [facility] Procedure:      VAS [place] ABI WITH/WO TBI Referring Phys: [id number] Erma Pinto Fawne Hughley --------------------------------------------------------------------------------  Indications: Claudication, rest pain, peripheral artery disease, and Decreased              pulse. High Risk Factors: Diabetes.  Performing Technologist: Reece Agar RT (R)(VS)  Examination Guidelines: A complete evaluation includes at minimum, Doppler waveform signals and systolic blood pressure reading at the level of bilateral brachial, anterior tibial, and posterior tibial arteries, when vessel segments are accessible. Bilateral testing is considered an integral part of a complete examination. Photoelectric Plethysmograph (PPG) waveforms and toe systolic pressure readings are included as required and additional duplex testing as needed. Limited examinations for reoccurring indications may be performed as noted.  ABI Findings: +---------+------------------+-----+----------+--------+ Right    Rt Pressure (mmHg)IndexWaveform  Comment  +---------+------------------+-----+----------+--------+ Brachial 169                                       +---------+------------------+-----+----------+--------+ ATA      88                0.52 monophasic         +---------+------------------+-----+----------+--------+ PTA      98                0.58 monophasic          +---------+------------------+-----+----------+--------+ Great Toe6                 0.04 Absent             +---------+------------------+-----+----------+--------+ +---------+------------------+-----+----------+-------+  Left     Lt Pressure (mmHg)IndexWaveform  Comment +---------+------------------+-----+----------+-------+ Brachial 164                                      +---------+------------------+-----+----------+-------+ ATA      108               0.64 monophasic        +---------+------------------+-----+----------+-------+ PTA      136               0.80 biphasic          +---------+------------------+-----+----------+-------+ Great Toe71                0.42 Abnormal          +---------+------------------+-----+----------+-------+ Summary: Right: Resting right ankle-brachial index indicates moderate right lower extremity arterial disease. The right toe-brachial index is abnormal. Left: Resting left ankle-brachial index indicates mild left lower extremity arterial disease. The left toe-brachial index is abnormal.  *See table(s) above for measurements and observations.  Electronically signed by Levora DredgeGregory Schnier MD on 01/18/2021 at 8:44:13 AM.    Final        Assessment & Plan:   1. 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  Advised the patient that if treatment of the ingrown toenails requires her to hold her antiplatelet therapy, she will need to wait approximately 3 months due to the new stent placed.  If no holding of her antiplatelets is necessary then she can proceed right away.  She should be able to heal a wound that occurs from treatment of her ingrown toenail.  The patient also continues to have disease within the left lower extremity however it is not as severe as the right previously was.  We also discussed that  we can wait until after the ingrown toenail issue treated to proceed with left lower extremity angiogram.  Otherwise patient is advised to contact us if symptoms worsen and we will proceed sooner.  2. Type 2 diabetes mellitus without complication, without long-term current use of insulin (HCC) Continue hypoglycemic medications as already ordered, these medications have been reviewed and there are no changes at this time.  Hgb A1C to be monitored as already arranged by primary service    Current Outpatient Medications on File Prior to Visit  Medication Sig Dispense Refill   aspirin EC 81 MG tablet Take 1 tablet (81 mg total) by mouth daily. 150 tablet 2   atorvastatin (LIPITOR) 10 MG tablet Take 1 tablet (10 mg total) by mouth daily. 30 tablet 11   atorvastatin (LIPITOR) 10 MG tablet Take 1 tablet by mouth daily.     BD INSULIN SYRINGE U/F 31G X 5/16" 0.3 ML MISC See admin instructions.     clopidogrel (PLAVIX) 75 MG tablet Take 1 tablet (75 mg total) by mouth daily. 30 tablet 11   glucose blood (PRECISION QID TEST) test strip 1 each (1 strip total) 3 (three) times daily To check blood sugars Dx: E11.9     metFORMIN (GLUCOPHAGE) 500 MG tablet Take by mouth.     traZODone (DESYREL) 50 MG tablet Take 50 mg by mouth at bedtime.     insulin glargine (LANTUS) 100 UNIT/ML injection 7 units before breakfast (Patient taking differently: Inject 7 Units into the skin.  7 units before breakfast) 10 mL 3   No current facility-administered medications on file prior to visit.    There are no Patient Instructions on file for this visit. No follow-ups on file.   Georgiana Spinner, NP

## 2021-04-02 ENCOUNTER — Telehealth (INDEPENDENT_AMBULATORY_CARE_PROVIDER_SITE_OTHER): Payer: Self-pay

## 2021-04-02 NOTE — Telephone Encounter (Signed)
Spoke with the patient's granddaughter and the patient is scheduled with Dr. Wyn Quaker for a LLE angio on 04/05/21 with a 11:45 am arrival time to the MM. Pre-procedure instructions were discussed and per the granddaughter she was writing it down.

## 2021-04-05 ENCOUNTER — Other Ambulatory Visit: Payer: Self-pay

## 2021-04-05 ENCOUNTER — Encounter: Payer: Self-pay | Admitting: Vascular Surgery

## 2021-04-05 ENCOUNTER — Ambulatory Visit
Admission: RE | Admit: 2021-04-05 | Discharge: 2021-04-05 | Disposition: A | Discharge status: Home / Self Care | Payer: Medicare Other | Attending: Vascular Surgery | Admitting: Vascular Surgery

## 2021-04-05 ENCOUNTER — Encounter
Admission: RE | Disposition: A | Discharge status: Home / Self Care | Payer: Self-pay | Source: Home / Self Care | Attending: Vascular Surgery

## 2021-04-05 ENCOUNTER — Other Ambulatory Visit (INDEPENDENT_AMBULATORY_CARE_PROVIDER_SITE_OTHER): Payer: Self-pay | Admitting: Nurse Practitioner

## 2021-04-05 DIAGNOSIS — I70223 Atherosclerosis of native arteries of extremities with rest pain, bilateral legs: Secondary | ICD-10-CM

## 2021-04-05 DIAGNOSIS — I70219 Atherosclerosis of native arteries of extremities with intermittent claudication, unspecified extremity: Secondary | ICD-10-CM

## 2021-04-05 DIAGNOSIS — Z87891 Personal history of nicotine dependence: Secondary | ICD-10-CM | POA: Diagnosis not present

## 2021-04-05 DIAGNOSIS — Z833 Family history of diabetes mellitus: Secondary | ICD-10-CM | POA: Insufficient documentation

## 2021-04-05 DIAGNOSIS — E1151 Type 2 diabetes mellitus with diabetic peripheral angiopathy without gangrene: Secondary | ICD-10-CM | POA: Diagnosis not present

## 2021-04-05 DIAGNOSIS — I70222 Atherosclerosis of native arteries of extremities with rest pain, left leg: Secondary | ICD-10-CM | POA: Insufficient documentation

## 2021-04-05 DIAGNOSIS — Z8249 Family history of ischemic heart disease and other diseases of the circulatory system: Secondary | ICD-10-CM | POA: Diagnosis not present

## 2021-04-05 HISTORY — PX: LOWER EXTREMITY ANGIOGRAPHY: CATH118251

## 2021-04-05 LAB — BUN: BUN: 14 mg/dL (ref 8–23)

## 2021-04-05 LAB — GLUCOSE, CAPILLARY
Glucose-Capillary: 179 mg/dL — ABNORMAL HIGH (ref 70–99)
Glucose-Capillary: 67 mg/dL — ABNORMAL LOW (ref 70–99)
Glucose-Capillary: 102 mg/dL — ABNORMAL HIGH (ref 70–99)

## 2021-04-05 LAB — CREATININE, SERUM
Creatinine, Ser: 0.5 mg/dL (ref 0.44–1.00)
GFR, Estimated: >60 mL/min (ref >60)

## 2021-04-05 SURGERY — LOWER EXTREMITY ANGIOGRAPHY
Anesthesia: Moderate Sedation | Site: Leg Lower | Laterality: Left

## 2021-04-05 MED ORDER — SODIUM CHLORIDE 0.9% FLUSH: 3.0000 mL | Freq: Two times a day (BID) | INTRAVENOUS | Status: DC

## 2021-04-05 MED ORDER — HYDRALAZINE HCL 20 MG/ML IJ SOLN: 5.0000 mg | INTRAMUSCULAR | Status: DC | PRN

## 2021-04-05 MED ORDER — HEPARIN SODIUM (PORCINE) 1000 UNIT/ML IJ SOLN
INTRAMUSCULAR | Status: DC | PRN
  Administered 2021-04-05: 5000 [IU] via INTRAVENOUS

## 2021-04-05 MED ORDER — HEPARIN SODIUM (PORCINE) 1000 UNIT/ML IJ SOLN
INTRAMUSCULAR | Status: AC
  Filled 2021-04-05: qty 1

## 2021-04-05 MED ORDER — ACETAMINOPHEN 325 MG PO TABS
650.0000 mg | ORAL_TABLET | ORAL | Status: DC | PRN
  Administered 2021-04-05: 650 mg via ORAL

## 2021-04-05 MED ORDER — ACETAMINOPHEN 325 MG PO TABS
ORAL_TABLET | ORAL | Status: AC
  Filled 2021-04-05: qty 2

## 2021-04-05 MED ORDER — SODIUM CHLORIDE 0.9 % IV SOLN: INTRAVENOUS | Status: DC

## 2021-04-05 MED ORDER — DIPHENHYDRAMINE HCL 50 MG/ML IJ SOLN: 50.0000 mg | Freq: Once | INTRAMUSCULAR | Status: DC | PRN

## 2021-04-05 MED ORDER — LABETALOL HCL 5 MG/ML IV SOLN: 10.0000 mg | INTRAVENOUS | Status: DC | PRN

## 2021-04-05 MED ORDER — ONDANSETRON HCL 4 MG/2ML IJ SOLN: 4.0000 mg | Freq: Four times a day (QID) | INTRAMUSCULAR | Status: DC | PRN

## 2021-04-05 MED ORDER — FENTANYL CITRATE PF 50 MCG/ML IJ SOSY
PREFILLED_SYRINGE | INTRAMUSCULAR | Status: AC
  Filled 2021-04-05: qty 2

## 2021-04-05 MED ORDER — FAMOTIDINE 20 MG PO TABS: 40.0000 mg | ORAL_TABLET | Freq: Once | ORAL | Status: DC | PRN

## 2021-04-05 MED ORDER — HYDROMORPHONE HCL 1 MG/ML IJ SOLN
1.0000 mg | Freq: Once | INTRAMUSCULAR | Status: DC | PRN
Start: 2021-04-05 — End: 2021-04-05

## 2021-04-05 MED ORDER — MIDAZOLAM HCL 2 MG/ML PO SYRP: 8.0000 mg | ORAL_SOLUTION | Freq: Once | ORAL | Status: DC | PRN

## 2021-04-05 MED ORDER — MIDAZOLAM HCL 5 MG/5ML IJ SOLN
INTRAMUSCULAR | Status: AC
  Filled 2021-04-05: qty 5

## 2021-04-05 MED ORDER — CEFAZOLIN SODIUM-DEXTROSE 2-4 GM/100ML-% IV SOLN: 2.0000 g | Freq: Once | INTRAVENOUS | Status: AC

## 2021-04-05 MED ORDER — FENTANYL CITRATE (PF) 100 MCG/2ML IJ SOLN
INTRAMUSCULAR | Status: DC | PRN
  Administered 2021-04-05: 50 ug via INTRAVENOUS

## 2021-04-05 MED ORDER — METHYLPREDNISOLONE SODIUM SUCC 125 MG IJ SOLR: 125.0000 mg | Freq: Once | INTRAMUSCULAR | Status: DC | PRN

## 2021-04-05 MED ORDER — CEFAZOLIN SODIUM-DEXTROSE 2-4 GM/100ML-% IV SOLN
INTRAVENOUS | Status: AC
  Administered 2021-04-05: 2 g via INTRAVENOUS
  Filled 2021-04-05: qty 100

## 2021-04-05 MED ORDER — SODIUM CHLORIDE 0.9 % IV SOLN: 250.0000 mL | INTRAVENOUS | Status: DC | PRN

## 2021-04-05 MED ORDER — SODIUM CHLORIDE 0.9% FLUSH: 3.0000 mL | INTRAVENOUS | Status: DC | PRN

## 2021-04-05 MED ORDER — MIDAZOLAM HCL 2 MG/2ML IJ SOLN
INTRAMUSCULAR | Status: DC | PRN
  Administered 2021-04-05: 2 mg via INTRAVENOUS

## 2021-04-05 SURGICAL SUPPLY — 20 items
BALLN LUTONIX 018 4X150X130 (BALLOONS) ×2
BALLN LUTONIX 018 4X220X130 (BALLOONS) ×2
BALLN ULTRVRSE 018 2.5X100X150 (BALLOONS) ×2
BALLOON LUTONIX 018 4X150X130 (BALLOONS) ×1 IMPLANT
BALLOON LUTONIX 018 4X220X130 (BALLOONS) ×1 IMPLANT
BALLOON ULTRVS 018 2.5X100X150 (BALLOONS) ×1 IMPLANT
CATH ANGIO 5F PIGTAIL 65CM (CATHETERS) ×2 IMPLANT
CATH VERT 5X100 (CATHETERS) ×2 IMPLANT
COVER PROBE U/S 5X48 (MISCELLANEOUS) ×4 IMPLANT
DEVICE STARCLOSE SE CLOSURE (Vascular Products) ×2 IMPLANT
GLIDEWIRE ADV .035X260CM (WIRE) ×2 IMPLANT
KIT ENCORE 26 ADVANTAGE (KITS) ×2 IMPLANT
PACK ANGIOGRAPHY (CUSTOM PROCEDURE TRAY) ×2 IMPLANT
SHEATH ANL2 6FRX45 HC (SHEATH) ×2 IMPLANT
SHEATH BRITE TIP 5FRX11 (SHEATH) ×2 IMPLANT
STENT VIABAHN 5X150X120 (Permanent Stent) ×2 IMPLANT
STENT VIABAHN 5X250X120 (Permanent Stent) ×2 IMPLANT
SYR MEDRAD MARK 7 150ML (SYRINGE) ×2 IMPLANT
WIRE G V18X300CM (WIRE) ×2 IMPLANT
WIRE GUIDERIGHT .035X150 (WIRE) ×2 IMPLANT

## 2021-04-05 NOTE — Op Note (Signed)
[facility]  Percutaneous Study/Intervention Procedural Note   Date of Surgery: 04/05/2021  Surgeon(s):Shuaib Corsino    Assistants:none  Pre-operative Diagnosis: PAD with rest pain left lower extremity  Post-operative diagnosis:  Same  Procedure(s) Performed:             1.  Ultrasound guidance for vascular access right femoral artery             2.  Catheter placement into left common femoral artery from right femoral             3.  Aortogram and selective left lower extremity angiogram             4.  Percutaneous transluminal angioplasty of left anterior tibial artery with 2.5 mm diameter angioplasty balloon             5.  Percutaneous transluminal angioplasty of nearly the entire left SFA and popliteal arteries with 4 mm diameter Lutonix drug-coated angioplasty balloon  6.  Stent placement x2 to the left SFA and popliteal arteries with 5 mm diameter by 25 cm length and 5 mm diameter by 15 cm length Viabahn stent             7.  StarClose closure device right femoral artery  EBL: 5 cc  Contrast: 40 cc  Fluoro Time: 5.4 minutes  Moderate Conscious Sedation Time: approximately 36 minutes using 2 mg of Versed and 50 mcg of Fentanyl              Indications:  Patient is a 70 y.o.female with severe peripheral arterial disease and rest pain in both legs.  She is already undergone right lower extremity revascularization. The patient has noninvasive study showing significantly reduced ABI on the left. The patient is brought in for angiography for further evaluation and potential treatment.  Due to the limb threatening nature of the situation, angiogram was performed for attempted limb salvage. The patient is aware that if the procedure fails, amputation would be expected.  The patient also understands that even with successful revascularization, amputation may still be required due to the severity of the situation.  Risks and benefits are discussed and informed  consent is obtained.   Procedure:  The patient was identified and appropriate procedural time out was performed.  The patient was then placed supine on the table and prepped and draped in the usual sterile fashion. Moderate conscious sedation was administered during a face to face encounter with the patient throughout the procedure with my supervision of the RN administering medicines and monitoring the patient's vital signs, pulse oximetry, telemetry and mental status throughout from the start of the procedure until the patient was taken to the recovery room. Ultrasound was used to evaluate the right common femoral artery.  It was patent .  A digital ultrasound image was acquired.  A Seldinger needle was used to access the right common femoral artery under direct ultrasound guidance and a permanent image was performed.  A 0.035 J wire was advanced without resistance and a 5Fr sheath was placed.  Pigtail catheter was placed into the aorta and an AP aortogram was performed. This demonstrated a normal right renal artery.  Left renal artery was not well seen but this was likely due to catheter position.  The aorta was highly calcific but not stenotic.  There is disease in the common iliac arteries bilaterally worse on the left than the right that may require kissing stent placements at a later date.  Both external iliac arteries appear widely patent. I then crossed the aortic bifurcation and advanced to the left femoral head. Selective left lower extremity angiogram was then performed. This demonstrated fairly normal common femoral artery and profunda femoris artery with a diffusely diseased SFA and popliteal arteries with multiple areas of stenosis of greater than 70% throughout the course.  There was a typical tibial trifurcation with two-vessel runoff through both the posterior tibial and anterior tibial arteries distally although the anterior tibial artery had about an 80% stenosis in the proximal segment. It was  felt that it was in the patient's best interest to proceed with intervention after these images to avoid a second procedure and a larger amount of contrast and fluoroscopy based off of the findings from the initial angiogram. The patient was systemically heparinized and a 6 [place] Ansell sheath was then placed over the [employer] wire. I then used a Kumpe catheter and the advantage wire to navigate through the SFA and popliteal lesions and get into the anterior tibial artery.  I then used the Kumpe catheter and exchanged for a V 18 wire which was parked down at the ankle.  Anterior tibial artery was treated with a 2.5 mm diameter by 10 cm length angioplasty balloon inflated to 10 atm for 1 minute.  Completion imaging showed significant improvement with about a 2025% residual stenosis.  I then used a 4 mm diameter by 22 cm length and a 4 mm diameter by 15 cm length Lutonix drug-coated angioplasty balloon.  This was used to treat from the below-knee popliteal artery up to the proximal SFA.  Each inflation was about 8 to 10 atm for a minute.  Completion imaging showed diffuse disease with multiple areas of greater than 50% residual stenosis so I elected to place stents.  I started with a 5 mm diameter by 25 cm length Viabahn stent deployed this from the below-knee popliteal artery up to the mid SFA.  A 5 mm diameter by 15 cm length Viabahn stent was then deployed with minimal overlap starting a couple of centimeters below the origin of the SFA.  These were postdilated with 4 mm balloons with excellent angiographic completion result and less than 10% residual stenosis. I elected to terminate the procedure. The sheath was removed and StarClose closure device was deployed in the right femoral artery with excellent hemostatic result. The patient was taken to the recovery room in stable condition having tolerated the procedure well.  Findings:               Aortogram:  This demonstrated a normal right renal artery.   Left renal artery was not well seen but this was likely due to catheter position.  The aorta was highly calcific but not stenotic.  There is disease in the common iliac arteries bilaterally worse on the left than the right that may require kissing stent placements at a later date.  Both external iliac arteries appear widely patent.             Left lower Extremity: This demonstrated fairly normal common femoral artery and profunda femoris artery with a diffusely diseased SFA and popliteal arteries with multiple areas of stenosis of greater than 70% throughout the course.  There was a typical tibial trifurcation with two-vessel runoff through both the posterior tibial and anterior tibial arteries distally although the anterior tibial artery had about an 80% stenosis in the proximal segment.   Disposition: Patient was taken to the recovery room in  stable condition having tolerated the procedure well.  Complications: None  Leotis Pain 04/05/2021 3:39 PM   This note was created with Dragon Medical transcription system. Any errors in dictation are purely unintentional.

## 2021-04-05 NOTE — H&P (Signed)
[facility] Admission History & Physical  MRN : [id number]  Carrie Hernandez is a 70 y.o. ([birth year]) female who presents with chief complaint of No chief complaint on file. Marland Kitchen  History of Present Illness: Patient presents today to address her peripheral arterial disease.  She has severe peripheral arterial disease and is already undergone right lower extremity revascularization a few months ago.  That leg is much better.  She now has disabling claudication and some early rest pain symptoms on the left leg.  Significantly reduced ABI documented previously.  Current Facility-Administered Medications  Medication Dose Route Frequency Provider Last Rate Last Admin   0.9 %  sodium chloride infusion   Intravenous Continuous Georgiana Spinner, NP       ceFAZolin (ANCEF) 2-4 GM/100ML-% IVPB            ceFAZolin (ANCEF) IVPB 2g/100 mL premix  2 g Intravenous Once Georgiana Spinner, NP       diphenhydrAMINE (BENADRYL) injection 50 mg  50 mg Intravenous Once PRN Georgiana Spinner, NP       famotidine (PEPCID) tablet 40 mg  40 mg Oral Once PRN Georgiana Spinner, NP       fentaNYL (SUBLIMAZE) 50 MCG/ML injection            heparin sodium (porcine) 1000 UNIT/ML injection            HYDROmorphone (DILAUDID) injection 1 mg  1 mg Intravenous Once PRN Georgiana Spinner, NP       methylPREDNISolone sodium succinate (SOLU-MEDROL) 125 mg/2 mL injection 125 mg  125 mg Intravenous Once PRN Georgiana Spinner, NP       midazolam (VERSED) 2 MG/ML syrup 8 mg  8 mg Oral Once PRN Georgiana Spinner, NP       midazolam (VERSED) 5 MG/5ML injection            ondansetron (ZOFRAN) injection 4 mg  4 mg Intravenous Q6H PRN Georgiana Spinner, NP        Past Medical History:  Diagnosis Date   Diabetes mellitus without complication [facility])     Past Surgical History:  Procedure Laterality Date   LOWER EXTREMITY ANGIOGRAPHY Right 01/11/2021   Procedure: LOWER EXTREMITY ANGIOGRAPHY;  Surgeon: Annice Needy, MD;   Location: [facility] INVASIVE CV LAB;  Service: Cardiovascular;  Laterality: Right;     Social History   Tobacco Use   Smoking status: Former    Packs/day: 0.50    Types: Cigarettes   Smokeless tobacco: Never  Substance Use Topics   Alcohol use: No     Family History  Problem Relation Age of Onset   Diabetes Mother    Lung cancer Brother    Heart attack Brother    Diabetes Maternal Aunt    Diabetes Maternal Uncle     No Known Allergies   REVIEW OF SYSTEMS (Negative unless checked)  Constitutional: [] Weight loss  [] Fever  [] Chills Cardiac: [] Chest pain   [] Chest pressure   [] Palpitations   [] Shortness of breath when laying flat   [] Shortness of breath at rest   [] Shortness of breath with exertion. Vascular:  [x] Pain in legs with walking   [] Pain in legs at rest   [] Pain in legs when laying flat   [x] Claudication   [] Pain in feet when walking  [x] Pain in feet at rest  [] Pain in feet when laying flat   [] History of DVT   [] Phlebitis   [] Swelling in legs   []   Varicose veins   [] Non-healing ulcers Pulmonary:   [] Uses home oxygen   [] Productive cough   [] Hemoptysis   [] Wheeze  [] COPD   [] Asthma Neurologic:  [] Dizziness  [] Blackouts   [] Seizures   [] History of stroke   [] History of TIA  [] Aphasia   [] Temporary blindness   [] Dysphagia   [] Weakness or numbness in arms   [] Weakness or numbness in legs Musculoskeletal:  [x] Arthritis   [] Joint swelling   [] Joint pain   [] Low back pain Hematologic:  [] Easy bruising  [] Easy bleeding   [] Hypercoagulable state   [] Anemic  [] Hepatitis Gastrointestinal:  [] Blood in stool   [] Vomiting blood  [] Gastroesophageal reflux/heartburn   [] Difficulty swallowing. Genitourinary:  [] Chronic kidney disease   [] Difficult urination  [] Frequent urination  [] Burning with urination   [] Blood in urine Skin:  [] Rashes   [] Ulcers   [] Wounds Psychological:  [] History of anxiety   []  History of major depression.  Physical Examination  Vitals:   04/05/21 1214  BP:  139/68  Pulse: 87  Resp: 18  Temp: 97.9 F (36.6 C)  TempSrc: Oral  SpO2: 100%  Weight: 49.4 kg  Height: 5\' 1"  (1.549 m)   Body mass index is 20.6 kg/m. Gen: WD/WN, NAD Head: [place]/AT, No temporalis wasting.  Ear/Nose/Throat: Hearing grossly intact, nares w/o erythema or drainage, oropharynx w/o Erythema/Exudate,  Eyes: Conjunctiva clear, sclera non-icteric Neck: Trachea midline.  No JVD.  Pulmonary:  Good air movement, respirations not labored, no use of accessory muscles.  Cardiac: RRR, normal S1, S2. Vascular:  Vessel Right Left  Radial Palpable Palpable                          PT 2+ palpable Not palpable  DP 1+ palpable 1+ palpable    Musculoskeletal: M/S 5/5 throughout.  No deformity or atrophy.  Neurologic: Sensation grossly intact in extremities.  Symmetrical.  Speech is fluent. Motor exam as listed above. Psychiatric: Judgment intact, Mood & affect appropriate for pt's clinical situation. Dermatologic: No rashes or ulcers noted.  No cellulitis or open wounds.      CBC Lab Results  Component Value Date   WBC 6.2 05/12/2016   HGB 14.7 05/12/2016   HCT 41.7 05/12/2016   MCV 94.3 05/12/2016   PLT 183 05/12/2016    BMET    Component Value Date/Time   NA 136 05/12/2016 1816   K 4.8 05/12/2016 1816   CL 98 (L) 05/12/2016 1816   CO2 29 05/12/2016 1816   GLUCOSE 238 (H) 05/12/2016 1816   BUN 14 01/11/2021 1425   CREATININE 0.65 01/11/2021 1425   CALCIUM 9.0 05/12/2016 1816   GFRNONAA >60 01/11/2021 1425   GFRAA >60 05/12/2016 1816   CrCl cannot be calculated (Patient's most recent lab result is older than the maximum 21 days allowed.).  COAG No results found for: INR, PROTIME  Radiology No results found.   Assessment/Plan 1.  PAD with rest pain bilateral lower extremities.  Right markedly improved after revascularization.  Left symptoms persist and she has now gotten her treatment for her right ingrown toenail taken care of so she is ready to  proceed with angiogram and possible revascularization. 2.  Diabetes.  Stable on outpatient medications and blood glucose control important in reducing the progression of atherosclerotic disease. Also, involved in wound healing. On appropriate medications.    , MD  04/05/2021 12:30 PM

## 2021-04-06 ENCOUNTER — Encounter: Payer: Self-pay | Admitting: Vascular Surgery

## 2021-04-19 ENCOUNTER — Telehealth (INDEPENDENT_AMBULATORY_CARE_PROVIDER_SITE_OTHER): Payer: Self-pay

## 2021-04-19 NOTE — Telephone Encounter (Signed)
Patient was advised that we will try to get her in sooner to be seen, and if her symptoms worsen to please go to the ED to be evaluated.

## 2021-04-19 NOTE — Telephone Encounter (Signed)
Patient's granddaughter called stating the patient is having swelling, pain and a large bruise on her foot near the ankle. Patient is taking Aspirin and Tylenol for pain. Patient was asked if she was wearing compression hose or elevating her leg and she declined to answer. Patient had a left leg angio on 04/05/21 with Dr. Wyn Quaker.

## 2021-04-19 NOTE — Telephone Encounter (Signed)
Given that she is on blood thinners bruising is not entirely abnormal.  Sometimes these bruises have been with slight trauma and sometimes they happen somewhat spontaneously.  The swelling is consistent with her recent angiogram and this happened with her previous angiogram as well.  The patient should be elevating her foot is much as possible.  She can place ice to the area near the bruise.  She should not stop her blood thinners because that can cause an issue with her stents.  If the bruise worsens significantly or if the discoloration is in the toes contact us to let us know.

## 2021-04-21 ENCOUNTER — Emergency Department: Payer: Medicare Other

## 2021-04-21 ENCOUNTER — Other Ambulatory Visit: Payer: Self-pay

## 2021-04-21 DIAGNOSIS — Z7902 Long term (current) use of antithrombotics/antiplatelets: Secondary | ICD-10-CM | POA: Diagnosis not present

## 2021-04-21 DIAGNOSIS — S9032XA Contusion of left foot, initial encounter: Secondary | ICD-10-CM | POA: Insufficient documentation

## 2021-04-21 DIAGNOSIS — E119 Type 2 diabetes mellitus without complications: Secondary | ICD-10-CM | POA: Insufficient documentation

## 2021-04-21 DIAGNOSIS — T889XXA Complication of surgical and medical care, unspecified, initial encounter: Secondary | ICD-10-CM | POA: Diagnosis not present

## 2021-04-21 DIAGNOSIS — Z7984 Long term (current) use of oral hypoglycemic drugs: Secondary | ICD-10-CM | POA: Diagnosis not present

## 2021-04-21 DIAGNOSIS — Z794 Long term (current) use of insulin: Secondary | ICD-10-CM | POA: Insufficient documentation

## 2021-04-21 DIAGNOSIS — M79605 Pain in left leg: Secondary | ICD-10-CM | POA: Diagnosis not present

## 2021-04-21 DIAGNOSIS — Z7982 Long term (current) use of aspirin: Secondary | ICD-10-CM | POA: Insufficient documentation

## 2021-04-21 DIAGNOSIS — S99922A Unspecified injury of left foot, initial encounter: Secondary | ICD-10-CM | POA: Diagnosis present

## 2021-04-21 DIAGNOSIS — X58XXXA Exposure to other specified factors, initial encounter: Secondary | ICD-10-CM | POA: Diagnosis not present

## 2021-04-21 DIAGNOSIS — Z87891 Personal history of nicotine dependence: Secondary | ICD-10-CM | POA: Diagnosis not present

## 2021-04-21 DIAGNOSIS — Z79899 Other long term (current) drug therapy: Secondary | ICD-10-CM | POA: Insufficient documentation

## 2021-04-21 NOTE — ED Triage Notes (Signed)
Pt comes with c/o post op problem pt states she had surgery on left leg with vein about 3 weeks ago. Pt has pain ad swelling. Pt states this started soon after the surgery.

## 2021-04-22 ENCOUNTER — Emergency Department
Admission: EM | Admit: 2021-04-22 | Discharge: 2021-04-22 | Disposition: A | Discharge status: Home / Self Care | Payer: Medicare Other | Attending: Emergency Medicine | Admitting: Emergency Medicine

## 2021-04-22 DIAGNOSIS — T148XXA Other injury of unspecified body region, initial encounter: Secondary | ICD-10-CM

## 2021-04-22 DIAGNOSIS — R609 Edema, unspecified: Secondary | ICD-10-CM

## 2021-04-22 NOTE — ED Provider Notes (Signed)
[facility] Emergency Department Provider Note  ____________________________________________  Time seen: Approximately 2:12 AM  I have reviewed the triage vital signs and the nursing notes.   HISTORY  Chief Complaint Post-op Problem   HPI Carrie Hernandez is a 70 y.o. female with a history of diabetes, peripheral artery disease on Plavix who presents for evaluation of bruising of her left foot.  Patient underwent angiography of her left leg 17 days ago.  She initially had bruising of the leg but the bruising has now moved to the foot.  She describes constant pain on both her feet which she has had for several years.  She denies any acute pain at this time.  She does report that she has been elevated the leg at home at nighttime. Tries to elevate during the day as well. Unable to tell me who many hours a day the leg is in a dependent position. She is taking Plavix as prescribed. Denies fever or chills, discoloration or cyanosis of the foot or toes.   Past Medical History:  Diagnosis Date   Diabetes mellitus without complication [facility])     Patient Active Problem List   Diagnosis Date Noted   Loss of memory 01/22/2020   Type 2 diabetes mellitus without complication, without long-term current use of insulin (HCC) 12/18/2017    Past Surgical History:  Procedure Laterality Date   LOWER EXTREMITY ANGIOGRAPHY Right 01/11/2021   Procedure: LOWER EXTREMITY ANGIOGRAPHY;  Surgeon: Annice Needy, MD;  Location: [facility] INVASIVE CV LAB;  Service: Cardiovascular;  Laterality: Right;   LOWER EXTREMITY ANGIOGRAPHY Left 04/05/2021   Procedure: LOWER EXTREMITY ANGIOGRAPHY;  Surgeon: Annice Needy, MD;  Location: [facility] INVASIVE CV LAB;  Service: Cardiovascular;  Laterality: Left;    Prior to Admission medications   Medication Sig Start Date End Date Taking? Authorizing Provider  aspirin EC 81 MG tablet Take 1 tablet (81 mg total) by mouth daily. 01/11/21   Annice Needy, MD   atorvastatin (LIPITOR) 10 MG tablet Take 1 tablet (10 mg total) by mouth daily. 01/11/21 01/11/22  Annice Needy, MD  atorvastatin (LIPITOR) 10 MG tablet Take 1 tablet by mouth daily. 01/11/21 01/11/22  [provider]  BD INSULIN SYRINGE U/F 31G X 5/16" 0.3 ML MISC See admin instructions. 12/14/20   [provider]  clopidogrel (PLAVIX) 75 MG tablet Take 1 tablet (75 mg total) by mouth daily. 01/11/21   Annice Needy, MD  glucose blood (PRECISION QID TEST) test strip 1 each (1 strip total) 3 (three) times daily To check blood sugars Dx: E11.9 11/24/20   [provider]  insulin glargine (LANTUS) 100 UNIT/ML injection 7 units before breakfast Patient taking differently: Inject 7 Units into the skin. 7 units before breakfast 10/02/19 04/05/21  Triplett, Rulon Eisenmenger B, FNP  metFORMIN (GLUCOPHAGE) 500 MG tablet Take by mouth. 10/11/19   [provider]  traZODone (DESYREL) 50 MG tablet Take 50 mg by mouth at bedtime. Patient not taking: Reported on 04/05/2021 11/20/20   [provider]    Allergies Patient has no known allergies.  Family History  Problem Relation Age of Onset   Diabetes Mother    Lung cancer Brother    Heart attack Brother    Diabetes Maternal Aunt    Diabetes Maternal Uncle     Social History Social History   Tobacco Use   Smoking status: Former    Packs/day: 0.50    Types: Cigarettes   Smokeless tobacco:  Never  Substance Use Topics   Alcohol use: No    Review of Systems  Constitutional: Negative for fever. Eyes: Negative for visual changes. ENT: Negative for sore throat. Neck: No neck pain  Cardiovascular: Negative for chest pain. Respiratory: Negative for shortness of breath. Gastrointestinal: Negative for abdominal pain, vomiting or diarrhea. Genitourinary: Negative for dysuria. Musculoskeletal: Negative for back pain. + L foot bruising Skin: Negative for rash. Neurological: Negative for headaches, weakness or numbness. Psych:  No SI or HI  ____________________________________________   PHYSICAL EXAM:  VITAL SIGNS: ED Triage Vitals  Enc Vitals Group     BP 04/21/21 1611 (!) 145/64     Pulse Rate 04/21/21 1611 85     Resp 04/21/21 1611 18     Temp 04/21/21 1611 98.2 F (36.8 C)     Temp Source 04/21/21 1611 Oral     SpO2 04/21/21 1611 98 %     Weight --      Height --      Head Circumference --      Peak Flow --      Pain Score 04/21/21 1617 7     Pain Loc --      Pain Edu? --      Excl. in GC? --     Constitutional: Alert and oriented. Well appearing and in no apparent distress. HEENT:      Head: Normocephalic and atraumatic.         Eyes: Conjunctivae are normal. Sclera is non-icteric.       Mouth/Throat: Mucous membranes are moist.       Neck: Supple with no signs of meningismus. Cardiovascular: Regular rate and rhythm. No murmurs, gallops, or rubs. 2+ symmetrical distal pulses are present in all extremities. No JVD. Respiratory: Normal respiratory effort. Lungs are clear to auscultation bilaterally.  Gastrointestinal: Soft, non tender. Musculoskeletal:  There is small amount of bruising on the lateral aspect of the dorsum of the L foot, patient has no significant swelling when compared to the other leg, she has strong PT and DP pulses on palpation, brisk capillary refill, no erythema or warmth, no significant tenderness to palpation Neurologic: Normal speech and language. Face is symmetric. Moving all extremities. No gross focal neurologic deficits are appreciated. Skin: Skin is warm, dry and intact. No rash noted. Psychiatric: Mood and affect are normal. Speech and behavior are normal.  ____________________________________________   LABS (all labs ordered are listed, but only abnormal results are displayed)  Labs Reviewed - No data to display ____________________________________________  EKG  none  ____________________________________________  RADIOLOGY  I have personally reviewed  the images performed during this visit and I agree with the Radiologist's read.   Interpretation by Radiologist:  US Venous Img Lower Unilateral Left (DVT)  Result Date: 04/21/2021 CLINICAL DATA:  Left lower extremity pain and swelling. EXAM: LEFT LOWER EXTREMITY VENOUS DOPPLER ULTRASOUND TECHNIQUE: Gray-scale sonography with compression, as well as color and duplex ultrasound, were performed to evaluate the deep venous system(s) from the level of the common femoral vein through the popliteal and proximal calf veins. COMPARISON:  None. FINDINGS: VENOUS Normal compressibility of the common femoral, superficial femoral, and popliteal veins, as well as the visualized calf veins. Visualized portions of profunda femoral vein and great saphenous vein unremarkable. No filling defects to suggest DVT on grayscale or color Doppler imaging. Doppler waveforms show normal direction of venous flow, normal respiratory plasticity and response to augmentation. Limited views of the contralateral common femoral vein are unremarkable.  OTHER None. Limitations: none IMPRESSION: No evidence of left lower extremity DVT. Electronically Signed   By: Narda Rutherford M.D.   On: 04/21/2021 17:15     ____________________________________________   PROCEDURES  Procedure(s) performed: None Procedures   Critical Care performed:  None ____________________________________________   INITIAL IMPRESSION / ASSESSMENT AND PLAN / ED COURSE  70 y.o. female with a history of diabetes, peripheral artery disease on Plavix who presents for evaluation of bruising of her left foot after undergoing angiography of the leg 17 days ago.  Patient is extremely well-appearing in no distress with normal vital signs.  She has small amount of bruising on the lateral aspect of the left foot with no significant tenderness.  No signs of cellulitis.  No signs of ischemia with a strong palpable pulses and brisk capillary refill.  The skin is intact.  I  did review the phone advice given by patient's vascular surgery's office and also read the documentation of the procedure done by Dr. Wyn Quaker.  I feel at this point that this bruising is expected considering the patient is on blood thinners, has been keeping her leg down for portions of the day, and recently underwent an angiography.  I did recommend elevating the leg.  We discussed signs of ischemia and recommended return to the hospital if those develop.  Doppler US negative for DVT. Otherwise close follow-up with her surgeon next week.  The history was gathered from patient and her daughter who was at bedside.  Plan discussed with both of them as well.      _____________________________________________ Please note:  Patient was evaluated in Emergency Department today for the symptoms described in the history of present illness. Patient was evaluated in the context of the global COVID-19 pandemic, which necessitated consideration that the patient might be at risk for infection with the SARS-CoV-2 virus that causes COVID-19. Institutional protocols and algorithms that pertain to the evaluation of patients at risk for COVID-19 are in a state of rapid change based on information released by regulatory bodies including the CDC and federal and state organizations. These policies and algorithms were followed during the patient's care in the ED.  Some ED evaluations and interventions may be delayed as a result of limited staffing during the pandemic.   [place] Controlled Substance Database was reviewed by me. ____________________________________________   FINAL CLINICAL IMPRESSION(S) / ED DIAGNOSES   Final diagnoses:  Bruise      NEW MEDICATIONS STARTED DURING THIS VISIT:  ED Discharge Orders     None        Note:  This document was prepared using Dragon voice recognition software and may include unintentional dictation errors.    Nita Sickle, MD 04/22/21 [phone]

## 2021-04-22 NOTE — ED Notes (Signed)
See paper chart during downtime for further documentation and DC.

## 2021-05-03 ENCOUNTER — Other Ambulatory Visit (INDEPENDENT_AMBULATORY_CARE_PROVIDER_SITE_OTHER): Payer: Self-pay | Admitting: Vascular Surgery

## 2021-05-03 DIAGNOSIS — Z95820 Peripheral vascular angioplasty status with implants and grafts: Secondary | ICD-10-CM

## 2021-05-03 DIAGNOSIS — I70222 Atherosclerosis of native arteries of extremities with rest pain, left leg: Secondary | ICD-10-CM

## 2021-05-05 ENCOUNTER — Ambulatory Visit (INDEPENDENT_AMBULATORY_CARE_PROVIDER_SITE_OTHER): Payer: Medicare Other | Admitting: Nurse Practitioner

## 2021-05-05 ENCOUNTER — Other Ambulatory Visit: Payer: Self-pay

## 2021-05-05 ENCOUNTER — Ambulatory Visit (INDEPENDENT_AMBULATORY_CARE_PROVIDER_SITE_OTHER): Payer: Medicare Other

## 2021-05-05 ENCOUNTER — Encounter (INDEPENDENT_AMBULATORY_CARE_PROVIDER_SITE_OTHER): Payer: Self-pay | Admitting: Nurse Practitioner

## 2021-05-05 VITALS — BP 113/72 | HR 91 | Resp 16 | Wt 111.0 lb

## 2021-05-05 DIAGNOSIS — I70223 Atherosclerosis of native arteries of extremities with rest pain, bilateral legs: Secondary | ICD-10-CM

## 2021-05-05 DIAGNOSIS — I70222 Atherosclerosis of native arteries of extremities with rest pain, left leg: Secondary | ICD-10-CM

## 2021-05-05 DIAGNOSIS — E119 Type 2 diabetes mellitus without complications: Secondary | ICD-10-CM

## 2021-05-05 DIAGNOSIS — R208 Other disturbances of skin sensation: Secondary | ICD-10-CM

## 2021-05-05 DIAGNOSIS — Z95820 Peripheral vascular angioplasty status with implants and grafts: Secondary | ICD-10-CM | POA: Diagnosis not present

## 2021-05-05 MED ORDER — GABAPENTIN 300 MG PO CAPS: 300.0000 mg | ORAL_CAPSULE | Freq: Every day | ORAL | 3 refills | Status: AC

## 2021-05-07 DIAGNOSIS — G629 Polyneuropathy, unspecified: Secondary | ICD-10-CM | POA: Insufficient documentation

## 2021-05-07 DIAGNOSIS — F331 Major depressive disorder, recurrent, moderate: Secondary | ICD-10-CM | POA: Insufficient documentation

## 2021-05-10 ENCOUNTER — Encounter (INDEPENDENT_AMBULATORY_CARE_PROVIDER_SITE_OTHER): Payer: Self-pay | Admitting: Nurse Practitioner

## 2021-05-10 NOTE — Progress Notes (Signed)
Subjective:    Patient ID: Carrie Hernandez, female    DOB: [birth year], 70 y.o.   MRN: [id number] Chief Complaint  Patient presents with   Follow-up    [facility] 4wk post op    The patient returns to the office for followup and review status post angiogram with intervention. The patient notes improvement in the lower extremity symptoms. No interval shortening of the patient's claudication distance or rest pain symptoms. Previous wounds have now healed.  No new ulcers or wounds have occurred since the last visit.  There have been no significant changes to the patient's overall health care, however the patient does note having some burning in her feet.  Her most recent A1c was 11 and she is having some difficulties with decreasing it.  The patient denies amaurosis fugax or recent TIA symptoms. There are no recent neurological changes noted. The patient denies history of DVT, PE or superficial thrombophlebitis. The patient denies recent episodes of angina or shortness of breath.   ABI's Rt=1.19 and Lt=1.14  (previous ABI's Rt=1.25 and Lt=0.84) Duplex US of the bilateral tibial arteries shows triphasic waveforms with good toe waveforms bilaterally   Review of Systems  Neurological:        Burning sensation in feet  All other systems reviewed and are negative.     Objective:   Physical Exam Vitals reviewed.  HENT:     Head: Normocephalic.  Cardiovascular:     Rate and Rhythm: Normal rate and regular rhythm.     Pulses: Normal pulses.  Pulmonary:     Effort: Pulmonary effort is normal.  Musculoskeletal:     Right lower leg: No edema.     Left lower leg: No edema.  Skin:    General: Skin is warm and dry.  Neurological:     Mental Status: She is alert and oriented to person, place, and time.  Psychiatric:        Mood and Affect: Mood normal.        Behavior: Behavior normal.        Thought Content: Thought content normal.        Judgment: Judgment normal.    BP 113/72 (BP  Location: Left Arm)   Pulse 91   Resp 16   Wt 111 lb (50.3 kg)   BMI 20.97 kg/m   Past Medical History:  Diagnosis Date   Diabetes mellitus without complication ([place])     Social History   Socioeconomic History   Marital status: Widowed    Spouse name: Not on file   Number of children: Not on file   Years of education: Not on file   Highest education level: Not on file  Occupational History   Not on file  Tobacco Use   Smoking status: Former    Packs/day: 0.50    Types: Cigarettes   Smokeless tobacco: Never  Substance and Sexual Activity   Alcohol use: No   Drug use: Not on file   Sexual activity: Not on file  Other Topics Concern   Not on file  Social History Narrative   Not on file   Social Determinants of Health   Financial Resource Strain: Not on file  Food Insecurity: Not on file  Transportation Needs: Not on file  Physical Activity: Not on file  Stress: Not on file  Social Connections: Not on file  Intimate Partner Violence: Not on file    Past Surgical History:  Procedure Laterality Date   LOWER EXTREMITY  ANGIOGRAPHY Right 01/11/2021   Procedure: LOWER EXTREMITY ANGIOGRAPHY;  Surgeon: Annice Needy, MD;  Location: [facility] INVASIVE CV LAB;  Service: Cardiovascular;  Laterality: Right;   LOWER EXTREMITY ANGIOGRAPHY Left 04/05/2021   Procedure: LOWER EXTREMITY ANGIOGRAPHY;  Surgeon: Annice Needy, MD;  Location: [facility] INVASIVE CV LAB;  Service: Cardiovascular;  Laterality: Left;    Family History  Problem Relation Age of Onset   Diabetes Mother    Lung cancer Brother    Heart attack Brother    Diabetes Maternal Aunt    Diabetes Maternal Uncle     No Known Allergies  CBC Latest Ref Rng & Units 05/12/2016  WBC 3.6 - 11.0 K/uL 6.2  Hemoglobin 12.0 - 16.0 g/dL 67.8  Hematocrit 93.8 - 47.0 % 41.7  Platelets 150 - 440 K/uL 183      CMP     Component Value Date/Time   NA 136 05/12/2016 1816   K 4.8 05/12/2016 1816   CL 98 (L) 05/12/2016 1816   CO2  29 05/12/2016 1816   GLUCOSE 238 (H) 05/12/2016 1816   BUN 14 04/05/2021 1135   CREATININE 0.50 04/05/2021 1135   CALCIUM 9.0 05/12/2016 1816   PROT 6.8 05/12/2016 1816   ALBUMIN 4.0 05/12/2016 1816   AST 21 05/12/2016 1816   ALT 12 (L) 05/12/2016 1816   ALKPHOS 119 05/12/2016 1816   BILITOT 0.7 05/12/2016 1816   GFRNONAA >60 04/05/2021 1135   GFRAA >60 05/12/2016 1816     No results found.     Assessment & Plan:   1. 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  The patient does not voice lifestyle limiting changes at this point in time.  Noninvasive studies do not suggest clinically significant change.  No invasive studies, angiography or surgery at this time The patient should continue walking and begin a more formal exercise program.  The patient should continue antiplatelet therapy and aggressive treatment of the lipid abnormalities  No changes in the patient's medications at this time  The patient should continue wearing graduated compression socks 10-15 mmHg strength to control the mild edema.    2. Type 2 diabetes mellitus without complication, without long-term current use of insulin (HCC) Continue hypoglycemic medications as already ordered, these medications have been reviewed and there are no changes at this time.  Hgb A1C to be monitored as already arranged by primary service   3. Burning sensation of feet I suspect the patient may be suffering with neuropathy.  We will send the patient to neurology for further work-up as well as prescribed gabapentin in the interim. - Ambulatory referral to Neurology   Current Outpatient Medications on File Prior to Visit  Medication Sig Dispense Refill   aspirin EC 81 MG tablet Take 1 tablet (81 mg total) by mouth daily. 150 tablet 2   atorvastatin (LIPITOR) 10 MG tablet Take 1  tablet (10 mg total) by mouth daily. 30 tablet 11   atorvastatin (LIPITOR) 10 MG tablet Take 1 tablet by mouth daily.     BD INSULIN SYRINGE U/F 31G X 5/16" 0.3 ML MISC See admin instructions.     clopidogrel (PLAVIX) 75 MG tablet Take 1 tablet (75 mg total) by mouth daily. 30 tablet 11   glucose blood (PRECISION QID TEST) test strip 1 each (1 strip total) 3 (three) times daily To check blood sugars Dx: E11.9  insulin glargine (LANTUS) 100 UNIT/ML injection 7 units before breakfast (Patient taking differently: Inject 7 Units into the skin. 7 units before breakfast) 10 mL 3   metFORMIN (GLUCOPHAGE) 500 MG tablet Take by mouth.     traZODone (DESYREL) 50 MG tablet Take 50 mg by mouth at bedtime. (Patient not taking: Reported on 04/05/2021)     No current facility-administered medications on file prior to visit.    There are no Patient Instructions on file for this visit. No follow-ups on file.   Kris Hartmann, NP

## 2021-05-14 ENCOUNTER — Encounter (INDEPENDENT_AMBULATORY_CARE_PROVIDER_SITE_OTHER): Payer: Medicare Other

## 2021-05-14 ENCOUNTER — Ambulatory Visit (INDEPENDENT_AMBULATORY_CARE_PROVIDER_SITE_OTHER): Payer: Medicare Other | Admitting: Vascular Surgery

## 2021-07-13 ENCOUNTER — Other Ambulatory Visit: Payer: Self-pay

## 2021-07-13 ENCOUNTER — Ambulatory Visit (INDEPENDENT_AMBULATORY_CARE_PROVIDER_SITE_OTHER): Payer: Medicare Other

## 2021-07-13 ENCOUNTER — Other Ambulatory Visit (INDEPENDENT_AMBULATORY_CARE_PROVIDER_SITE_OTHER): Payer: Self-pay | Admitting: Vascular Surgery

## 2021-07-13 ENCOUNTER — Ambulatory Visit (INDEPENDENT_AMBULATORY_CARE_PROVIDER_SITE_OTHER): Payer: Medicare Other | Admitting: Vascular Surgery

## 2021-07-13 VITALS — BP 105/62 | HR 90 | Ht 63.0 in | Wt 119.0 lb

## 2021-07-13 DIAGNOSIS — E119 Type 2 diabetes mellitus without complications: Secondary | ICD-10-CM | POA: Diagnosis not present

## 2021-07-13 DIAGNOSIS — I739 Peripheral vascular disease, unspecified: Secondary | ICD-10-CM

## 2021-07-13 DIAGNOSIS — I70223 Atherosclerosis of native arteries of extremities with rest pain, bilateral legs: Secondary | ICD-10-CM | POA: Diagnosis not present

## 2021-07-13 NOTE — Assessment & Plan Note (Signed)
Her ABIs today were markedly reduced at 0.39 on the right and 0.37 on the left.  They were normal two months ago.  This is a critical and limb threatening situation and she now has an ulcer on the right.  The left leg hurts more.  Both legs need angiogram in the near future in a staged fashion to try to improve the circulation.  She has very small vessels and limited runoff so durability is going to be an issue.  This is clearly a critical and limb threatening situation in both legs.

## 2021-07-13 NOTE — H&P (View-Only) (Signed)
MRN : [id number]  Carrie Hernandez is a 71 y.o. ([birth year]) female who presents with chief complaint of  Chief Complaint  Patient presents with   Follow-up    3 Mo ABI  .  History of Present Illness: Patient returns today in follow up of her PAD.  She had extensive BLE revascularizations several months ago and initially had good results. However,over the past few weeks her pain has recurred and she has an ulcer on the right fourth toe. She has clear rest pain on both sides. They are cold and painful all the time.  Her ABIs today were markedly reduced at 0.39 on the right and 0.37 on the left.  They were normal two months ago.   Current Outpatient Medications  Medication Sig Dispense Refill   aspirin EC 81 MG tablet Take 1 tablet (81 mg total) by mouth daily. 150 tablet 2   atorvastatin (LIPITOR) 10 MG tablet Take 1 tablet (10 mg total) by mouth daily. 30 tablet 11   atorvastatin (LIPITOR) 10 MG tablet Take 1 tablet by mouth daily.     BD INSULIN SYRINGE U/F 31G X 5/16" 0.3 ML MISC See admin instructions.     clopidogrel (PLAVIX) 75 MG tablet Take 1 tablet (75 mg total) by mouth daily. 30 tablet 11   gabapentin (NEURONTIN) 300 MG capsule Take 1 capsule (300 mg total) by mouth at bedtime. 30 capsule 3   glucose blood (PRECISION QID TEST) test strip 1 each (1 strip total) 3 (three) times daily To check blood sugars Dx: E11.9     metFORMIN (GLUCOPHAGE) 500 MG tablet Take by mouth.     traZODone (DESYREL) 50 MG tablet Take 50 mg by mouth at bedtime.     insulin glargine (LANTUS) 100 UNIT/ML injection 7 units before breakfast (Patient taking differently: Inject 7 Units into the skin. 7 units before breakfast) 10 mL 3   No current facility-administered medications for this visit.    Past Medical History:  Diagnosis Date   Diabetes mellitus without complication [facility])     Past Surgical History:  Procedure Laterality Date   LOWER EXTREMITY ANGIOGRAPHY Right 01/11/2021   Procedure: LOWER  EXTREMITY ANGIOGRAPHY;  Surgeon: Algernon Huxley, MD;  Location: [place] CV LAB;  Service: Cardiovascular;  Laterality: Right;   LOWER EXTREMITY ANGIOGRAPHY Left 04/05/2021   Procedure: LOWER EXTREMITY ANGIOGRAPHY;  Surgeon: Algernon Huxley, MD;  Location: [place] CV LAB;  Service: Cardiovascular;  Laterality: Left;     Social History   Tobacco Use   Smoking status: Former    Packs/day: 0.50    Types: Cigarettes   Smokeless tobacco: Never  Substance Use Topics   Alcohol use: No       Family History  Problem Relation Age of Onset   Diabetes Mother    Lung cancer Brother    Heart attack Brother    Diabetes Maternal Aunt    Diabetes Maternal Uncle      No Known Allergies   REVIEW OF SYSTEMS (Negative unless checked)  Constitutional: [] Weight loss  [] Fever  [] Chills Cardiac: [] Chest pain   [] Chest pressure   [] Palpitations   [] Shortness of breath when laying flat   [] Shortness of breath at rest   [] Shortness of breath with exertion. Vascular:  [x] Pain in legs with walking   [] Pain in legs at rest   [] Pain in legs when laying flat   [] Claudication   [] Pain in feet when walking  [x] Pain in feet at  rest  [x] Pain in feet when laying flat   [] History of DVT   [] Phlebitis   [] Swelling in legs   [] Varicose veins   [x] Non-healing ulcers Pulmonary:   [] Uses home oxygen   [] Productive cough   [] Hemoptysis   [] Wheeze  [] COPD   [] Asthma Neurologic:  [] Dizziness  [] Blackouts   [] Seizures   [] History of stroke   [] History of TIA  [] Aphasia   [] Temporary blindness   [] Dysphagia   [] Weakness or numbness in arms   [] Weakness or numbness in legs Musculoskeletal:  [x] Arthritis   [] Joint swelling   [] Joint pain   [] Low back pain Hematologic:  [] Easy bruising  [] Easy bleeding   [] Hypercoagulable state   [] Anemic   Gastrointestinal:  [] Blood in stool   [] Vomiting blood  [] Gastroesophageal reflux/heartburn   [] Abdominal pain Genitourinary:  [] Chronic kidney disease   [] Difficult urination   [] Frequent urination  [] Burning with urination   [] Hematuria Skin:  [] Rashes   [x] Ulcers   [x] Wounds Psychological:  [] History of anxiety   []  History of major depression.  Physical Examination  BP 105/62    Pulse 90    Ht 5\' 3"  (1.6 m)    Wt 119 lb 0.8 oz (54 kg)    BMI 21.09 kg/m  Gen:  WD/WN, NAD Head: [place]/AT, No temporalis wasting. Ear/Nose/Throat: Hearing grossly intact, nares w/o erythema or drainage Eyes: Conjunctiva clear. Sclera non-icteric Neck: Supple.  Trachea midline Pulmonary:  Good air movement, no use of accessory muscles.  Cardiac: RRR, no JVD Vascular:  Vessel Right Left  Radial Palpable Palpable                          PT Not Palpable Not Palpable  DP Not Palpable Not Palpable   Gastrointestinal: soft, non-tender/non-distended. No guarding/reflex.  Musculoskeletal: M/S 5/5 throughout.  No deformity or atrophy. Sluggish capillary refill into both feet. Small ulcer on the right fourth toe. Neurologic: Sensation grossly intact in extremities.  Symmetrical.  Speech is fluent.  Psychiatric: Judgment intact, Mood & affect appropriate for pt's clinical situation. Dermatologic: small clean ulcer as above.      Labs No results found for this or any previous visit (from the past 2160 hour(s)).  Radiology No results found.  Assessment/Plan  Type 2 diabetes mellitus without complication, without long-term current use of insulin (HCC) blood glucose control important in reducing the progression of atherosclerotic disease. Also, involved in wound healing. On appropriate medications.   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 ([place]) Her ABIs today were markedly reduced at 0.39 on the right and 0.37 on the left.  They were normal two months ago.  This is a critical and limb threatening situation and she now has an ulcer on the right.  The left leg hurts more.  Both legs need angiogram in the near future in a staged fashion to try to improve the  circulation.  She has very small vessels and limited runoff so durability is going to be an issue.  This is clearly a critical and limb threatening situation in both legs.      Leotis Pain, MD  07/13/2021 5:04 PM    This note was created with Dragon medical transcription system.  Any errors from dictation are purely unintentional

## 2021-07-13 NOTE — Assessment & Plan Note (Signed)
blood glucose control important in reducing the progression of atherosclerotic disease. Also, involved in wound healing. On appropriate medications.  

## 2021-07-13 NOTE — Progress Notes (Signed)
MRN : [id number]  Carrie Hernandez is a 71 y.o. (1951-03-14) female who presents with chief complaint of  Chief Complaint  Patient presents with   Follow-up    3 Mo ABI  .  History of Present Illness: Patient returns today in follow up of her PAD.  She had extensive BLE revascularizations several months ago and initially had good results. However,over the past few weeks her pain has recurred and she has an ulcer on the right fourth toe. She has clear rest pain on both sides. They are cold and painful all the time.  Her ABIs today were markedly reduced at 0.39 on the right and 0.37 on the left.  They were normal two months ago.   Current Outpatient Medications  Medication Sig Dispense Refill   aspirin EC 81 MG tablet Take 1 tablet (81 mg total) by mouth daily. 150 tablet 2   atorvastatin (LIPITOR) 10 MG tablet Take 1 tablet (10 mg total) by mouth daily. 30 tablet 11   atorvastatin (LIPITOR) 10 MG tablet Take 1 tablet by mouth daily.     BD INSULIN SYRINGE U/F 31G X 5/16" 0.3 ML MISC See admin instructions.     clopidogrel (PLAVIX) 75 MG tablet Take 1 tablet (75 mg total) by mouth daily. 30 tablet 11   gabapentin (NEURONTIN) 300 MG capsule Take 1 capsule (300 mg total) by mouth at bedtime. 30 capsule 3   glucose blood (PRECISION QID TEST) test strip 1 each (1 strip total) 3 (three) times daily To check blood sugars Dx: E11.9     metFORMIN (GLUCOPHAGE) 500 MG tablet Take by mouth.     traZODone (DESYREL) 50 MG tablet Take 50 mg by mouth at bedtime.     insulin glargine (LANTUS) 100 UNIT/ML injection 7 units before breakfast (Patient taking differently: Inject 7 Units into the skin. 7 units before breakfast) 10 mL 3   No current facility-administered medications for this visit.    Past Medical History:  Diagnosis Date   Diabetes mellitus without complication [facility])     Past Surgical History:  Procedure Laterality Date   LOWER EXTREMITY ANGIOGRAPHY Right 01/11/2021   Procedure: LOWER  EXTREMITY ANGIOGRAPHY;  Surgeon: Algernon Huxley, MD;  Location: [place] [facility];  Service: Cardiovascular;  Laterality: Right;   LOWER EXTREMITY ANGIOGRAPHY Left 04/05/2021   Procedure: LOWER EXTREMITY ANGIOGRAPHY;  Surgeon: Algernon Huxley, MD;  Location: [place] [facility];  Service: Cardiovascular;  Laterality: Left;     Social History   Tobacco Use   Smoking status: Former    Packs/day: 0.50    Types: Cigarettes   Smokeless tobacco: Never  Substance Use Topics   Alcohol use: No       Family History  Problem Relation Age of Onset   Diabetes Mother    Lung cancer Brother    Heart attack Brother    Diabetes Maternal Aunt    Diabetes Maternal Uncle      No Known Allergies   REVIEW OF SYSTEMS (Negative unless checked)  Constitutional: [] Weight loss  [] Fever  [] Chills Cardiac: [] Chest pain   [] Chest pressure   [] Palpitations   [] Shortness of breath when laying flat   [] Shortness of breath at rest   [] Shortness of breath with exertion. Vascular:  [x] Pain in legs with walking   [] Pain in legs at rest   [] Pain in legs when laying flat   [] Claudication   [] Pain in feet when walking  [x] Pain in feet at  rest  [x] Pain in feet when laying flat   [] History of DVT   [] Phlebitis   [] Swelling in legs   [] Varicose veins   [x] Non-healing ulcers Pulmonary:   [] Uses home oxygen   [] Productive cough   [] Hemoptysis   [] Wheeze  [] COPD   [] Asthma Neurologic:  [] Dizziness  [] Blackouts   [] Seizures   [] History of stroke   [] History of TIA  [] Aphasia   [] Temporary blindness   [] Dysphagia   [] Weakness or numbness in arms   [] Weakness or numbness in legs Musculoskeletal:  [x] Arthritis   [] Joint swelling   [] Joint pain   [] Low back pain Hematologic:  [] Easy bruising  [] Easy bleeding   [] Hypercoagulable state   [] Anemic   Gastrointestinal:  [] Blood in stool   [] Vomiting blood  [] Gastroesophageal reflux/heartburn   [] Abdominal pain Genitourinary:  [] Chronic kidney disease   [] Difficult urination   [] Frequent urination  [] Burning with urination   [] Hematuria Skin:  [] Rashes   [x] Ulcers   [x] Wounds Psychological:  [] History of anxiety   []  History of major depression.  Physical Examination  BP 105/62    Pulse 90    Ht 5\' 3"  (1.6 m)    Wt 119 lb 0.8 oz (54 kg)    BMI 21.09 kg/m  Gen:  WD/WN, NAD Head: [place]/AT, No temporalis wasting. Ear/Nose/Throat: Hearing grossly intact, nares w/o erythema or drainage Eyes: Conjunctiva clear. Sclera non-icteric Neck: Supple.  Trachea midline Pulmonary:  Good air movement, no use of accessory muscles.  Cardiac: RRR, no JVD Vascular:  Vessel Right Left  Radial Palpable Palpable                          PT Not Palpable Not Palpable  DP Not Palpable Not Palpable   Gastrointestinal: soft, non-tender/non-distended. No guarding/reflex.  Musculoskeletal: M/S 5/5 throughout.  No deformity or atrophy. Sluggish capillary refill into both feet. Small ulcer on the right fourth toe. Neurologic: Sensation grossly intact in extremities.  Symmetrical.  Speech is fluent.  Psychiatric: Judgment intact, Mood & affect appropriate for pt's clinical situation. Dermatologic: small clean ulcer as above.      Labs No results found for this or any previous visit (from the past 2160 hour(s)).  Radiology No results found.  Assessment/Plan  Type 2 diabetes mellitus without complication, without long-term current use of insulin (HCC) blood glucose control important in reducing the progression of atherosclerotic disease. Also, involved in wound healing. On appropriate medications.   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 ([place]) Her ABIs today were markedly reduced at 0.39 on the right and 0.37 on the left.  They were normal two months ago.  This is a critical and limb threatening situation and she now has an ulcer on the right.  The left leg hurts more.  Both legs need angiogram in the near future in a staged fashion to try to improve the  circulation.  She has very small vessels and limited runoff so durability is going to be an issue.  This is clearly a critical and limb threatening situation in both legs.      Leotis Pain, MD  07/13/2021 5:04 PM    This note was created with Dragon medical transcription system.  Any errors from dictation are purely unintentional

## 2021-07-13 NOTE — Patient Instructions (Signed)
Angiogram °An angiogram is a procedure used to examine the blood vessels. In this procedure, contrast dye is injected through a long, thin tube (catheter) into an artery. X-rays are then taken, which show if there is a blockage or problem in a blood vessel. °The catheter may be inserted in: °The groin area. This is the most common. °The fold of the arm, near the elbow. °The wrist. °Tell a health care provider about: °Any allergies you have, including allergies to medicines, shellfish, contrast dye, or iodine. °All medicines you are taking, including vitamins, herbs, eye drops, creams, and over-the-counter medicines. °Any problems you or family members have had with anesthetic medicines. °Any blood disorders you have. °Any surgeries you have had. °Any medical conditions you have or have had, including any kidney problems or kidney failure. °Whether you are pregnant or may be pregnant. °Whether you are breastfeeding. °What are the risks? °Generally, this is a safe procedure. However, problems may occur, including: °Infection. °Bleeding and bruising. °Allergic reactions to medicines or dyes, including the contrast dye used. °Damage to nearby structures or organs, including damage to blood vessels and kidney damage from the contrast dye. °Blood clots that can lead to a stroke or heart attack. °What happens before the procedure? °Staying hydrated °Follow instructions from your health care provider about hydration, which may include: °Up to 2 hours before the procedure - you may continue to drink clear liquids, such as water, clear fruit juice, black coffee, and plain tea. ° °Eating and drinking restrictions °Follow instructions from your health care provider about eating and drinking, which may include: °8 hours before the procedure - stop eating heavy meals or foods, such as meat, fried foods, or fatty foods. °6 hours before the procedure - stop eating light meals or foods, such as toast or cereal. °2 hours before the  procedure - stop drinking clear liquids. °Medicines °Ask your health care provider about: °Changing or stopping your regular medicines. This is especially important if you are taking diabetes medicines or blood thinners. °Taking medicines such as aspirin and ibuprofen. These medicines can thin your blood. Do not take these medicines unless your health care provider tells you to take them. °Taking over-the-counter medicines, vitamins, herbs, and supplements. °Surgery safety °Ask your health care provider: °How your insertion site will be marked. °What steps will be taken to help prevent infection. These may include: °Removing hair at the insertion site. °Washing skin with a germ-killing soap. °Taking antibiotic medicine. °General instructions °Do not use any products that contain nicotine or tobacco for at least 4 weeks before the procedure. These products include cigarettes, e-cigarettes, and chewing tobacco. If you need help quitting, ask your health care provider. °You may have blood samples taken. °Plan to have someone take you home from the hospital or clinic. °If you will be going home right after the procedure, plan to have someone with you for 24 hours. °What happens during the procedure? °You will lie on your back on an X-ray table. You may be strapped to the table if it is tilted. °An IV will be inserted into one of your veins. °Electrodes may be placed on your chest to monitor your heart rate during the procedure. °You will be given one or both of the following: °A medicine to help you relax (sedative). °A medicine to numb the area where the catheter will be inserted (local anesthetic). °A small incision will be made for catheter insertion. °The catheter will be inserted into an artery using a guide   wire. An X-ray procedure (fluoroscopy) will be used to help guide the catheter to the blood vessel to be examined. °A contrast dye will then be injected into the catheter, and X-rays will be taken. The contrast  will help to show where any narrowing or blockages are located in the blood vessels. You may feel flushed as the contrast dye is injected. °Tell your health care provider if you develop chest pain or trouble breathing. °After the fluoroscopy is complete, the catheter will be removed. °A bandage (dressing) will be placed over the site where the catheter was inserted. Pressure will be applied to help stop any bleeding. °The IV will be removed. °The procedure may vary among health care providers and hospitals. °What happens after the procedure? °Your blood pressure, heart rate, breathing rate, and blood oxygen level will be monitored until you leave the hospital or clinic. °You will be kept in bed lying flat for 6 hours. If the catheter was inserted through your leg, you will be instructed not to bend or cross your legs. °The insertion area and the pulse in your feet or wrist will be checked frequently. °You will be instructed to drink plenty of fluids. This will help wash the contrast dye out of your body. °You may have more blood tests and X-rays. You may also have a test that records the electrical activity of your heart (electrocardiogram, or ECG). °Do not drive for 24 hours if you were given a sedative during your procedure. °It is up to you to get the results of your procedure. Ask your health care provider, or the department that is doing the procedure, when your results will be ready. °Summary °An angiogram is a procedure used to examine the blood vessels. °Before the procedure, follow your health care provider's instructions about eating and drinking restrictions. You may be asked to stop eating and drinking several hours before the procedure. °During the procedure, contrast dye is injected through a thin tube (catheter) into an artery. X-rays are then taken. °After the procedure, you will need to drink plenty of fluids and lie flat for 6 hour. °This information is not intended to replace advice given to you  by your health care provider. Make sure you discuss any questions you have with your health care provider. °Document Revised: 12/05/2018 Document Reviewed: 12/05/2018 °Elsevier Patient Education © 2022 [employer] ° °

## 2021-07-14 ENCOUNTER — Telehealth (INDEPENDENT_AMBULATORY_CARE_PROVIDER_SITE_OTHER): Payer: Self-pay

## 2021-07-14 NOTE — Telephone Encounter (Signed)
Patient's 07/22/21 RLE angio has been rescheduled to 07/19/21 with a  7:45 am arrival time to the MM. Pre-procedure instructions will be mailed. Per an interpreter Tobi Bastos a message was left letting the patient know that her procedure date has been changed and to call with any questions.

## 2021-07-14 NOTE — Telephone Encounter (Signed)
Spoke with the daughter and granddaughter via interpreter [phone]) and the patient is scheduled for a LLE angio on 07-29-21 with a 12:45 pm arrival time to the MM. Patient was offered 07/19/21 but declined stating " Dr. Wyn Quaker told her to do Jul 29, 2021 because of blood flow". Patient's RLE angio is on 07/22/21 with a 6:45 am arrival time to the MM. Pre-procedure instructions were discussed and the angio on 07/22/21 will be mailed.

## 2021-07-15 ENCOUNTER — Other Ambulatory Visit (INDEPENDENT_AMBULATORY_CARE_PROVIDER_SITE_OTHER): Payer: Self-pay | Admitting: Nurse Practitioner

## 2021-07-15 ENCOUNTER — Encounter: Admission: RE | Disposition: E | Payer: Self-pay | Source: Home / Self Care | Attending: Vascular Surgery

## 2021-07-15 ENCOUNTER — Inpatient Hospital Stay
Admission: RE | Admit: 2021-07-15 | Discharge: 2021-08-04 | DRG: 270 | Disposition: E | Payer: Medicare Other | Attending: Vascular Surgery | Admitting: Vascular Surgery

## 2021-07-15 ENCOUNTER — Inpatient Hospital Stay: Payer: Medicare Other

## 2021-07-15 ENCOUNTER — Encounter: Payer: Self-pay | Admitting: Vascular Surgery

## 2021-07-15 ENCOUNTER — Other Ambulatory Visit: Payer: Self-pay

## 2021-07-15 DIAGNOSIS — N179 Acute kidney failure, unspecified: Secondary | ICD-10-CM | POA: Diagnosis not present

## 2021-07-15 DIAGNOSIS — E11621 Type 2 diabetes mellitus with foot ulcer: Secondary | ICD-10-CM | POA: Diagnosis present

## 2021-07-15 DIAGNOSIS — I70223 Atherosclerosis of native arteries of extremities with rest pain, bilateral legs: Secondary | ICD-10-CM

## 2021-07-15 DIAGNOSIS — Z66 Do not resuscitate: Secondary | ICD-10-CM | POA: Diagnosis not present

## 2021-07-15 DIAGNOSIS — I468 Cardiac arrest due to other underlying condition: Secondary | ICD-10-CM | POA: Diagnosis not present

## 2021-07-15 DIAGNOSIS — I493 Ventricular premature depolarization: Secondary | ICD-10-CM | POA: Diagnosis not present

## 2021-07-15 DIAGNOSIS — Z20822 Contact with and (suspected) exposure to covid-19: Secondary | ICD-10-CM | POA: Diagnosis present

## 2021-07-15 DIAGNOSIS — J189 Pneumonia, unspecified organism: Secondary | ICD-10-CM | POA: Diagnosis not present

## 2021-07-15 DIAGNOSIS — I471 Supraventricular tachycardia: Secondary | ICD-10-CM | POA: Diagnosis not present

## 2021-07-15 DIAGNOSIS — R578 Other shock: Secondary | ICD-10-CM | POA: Diagnosis not present

## 2021-07-15 DIAGNOSIS — G936 Cerebral edema: Secondary | ICD-10-CM | POA: Diagnosis not present

## 2021-07-15 DIAGNOSIS — J9601 Acute respiratory failure with hypoxia: Secondary | ICD-10-CM | POA: Diagnosis not present

## 2021-07-15 DIAGNOSIS — G9349 Other encephalopathy: Secondary | ICD-10-CM | POA: Diagnosis not present

## 2021-07-15 DIAGNOSIS — I214 Non-ST elevation (NSTEMI) myocardial infarction: Secondary | ICD-10-CM | POA: Diagnosis not present

## 2021-07-15 DIAGNOSIS — R0902 Hypoxemia: Secondary | ICD-10-CM

## 2021-07-15 DIAGNOSIS — E1151 Type 2 diabetes mellitus with diabetic peripheral angiopathy without gangrene: Secondary | ICD-10-CM | POA: Diagnosis present

## 2021-07-15 DIAGNOSIS — E872 Acidosis, unspecified: Secondary | ICD-10-CM | POA: Diagnosis not present

## 2021-07-15 DIAGNOSIS — E1142 Type 2 diabetes mellitus with diabetic polyneuropathy: Secondary | ICD-10-CM | POA: Diagnosis present

## 2021-07-15 DIAGNOSIS — I259 Chronic ischemic heart disease, unspecified: Secondary | ICD-10-CM

## 2021-07-15 DIAGNOSIS — K7200 Acute and subacute hepatic failure without coma: Secondary | ICD-10-CM | POA: Diagnosis not present

## 2021-07-15 DIAGNOSIS — I998 Other disorder of circulatory system: Secondary | ICD-10-CM | POA: Diagnosis present

## 2021-07-15 DIAGNOSIS — D6489 Other specified anemias: Secondary | ICD-10-CM | POA: Diagnosis not present

## 2021-07-15 DIAGNOSIS — Z833 Family history of diabetes mellitus: Secondary | ICD-10-CM

## 2021-07-15 DIAGNOSIS — Z8249 Family history of ischemic heart disease and other diseases of the circulatory system: Secondary | ICD-10-CM

## 2021-07-15 DIAGNOSIS — Z79899 Other long term (current) drug therapy: Secondary | ICD-10-CM

## 2021-07-15 DIAGNOSIS — D62 Acute posthemorrhagic anemia: Secondary | ICD-10-CM | POA: Diagnosis not present

## 2021-07-15 DIAGNOSIS — Z515 Encounter for palliative care: Secondary | ICD-10-CM

## 2021-07-15 DIAGNOSIS — I63449 Cerebral infarction due to embolism of unspecified cerebellar artery: Secondary | ICD-10-CM | POA: Diagnosis not present

## 2021-07-15 DIAGNOSIS — I5041 Acute combined systolic (congestive) and diastolic (congestive) heart failure: Secondary | ICD-10-CM | POA: Diagnosis not present

## 2021-07-15 DIAGNOSIS — R778 Other specified abnormalities of plasma proteins: Secondary | ICD-10-CM | POA: Diagnosis not present

## 2021-07-15 DIAGNOSIS — Z7984 Long term (current) use of oral hypoglycemic drugs: Secondary | ICD-10-CM

## 2021-07-15 DIAGNOSIS — N390 Urinary tract infection, site not specified: Secondary | ICD-10-CM | POA: Diagnosis not present

## 2021-07-15 DIAGNOSIS — I639 Cerebral infarction, unspecified: Secondary | ICD-10-CM | POA: Diagnosis not present

## 2021-07-15 DIAGNOSIS — Z7982 Long term (current) use of aspirin: Secondary | ICD-10-CM

## 2021-07-15 DIAGNOSIS — I651 Occlusion and stenosis of basilar artery: Secondary | ICD-10-CM | POA: Diagnosis present

## 2021-07-15 DIAGNOSIS — Z452 Encounter for adjustment and management of vascular access device: Secondary | ICD-10-CM

## 2021-07-15 DIAGNOSIS — Z87891 Personal history of nicotine dependence: Secondary | ICD-10-CM

## 2021-07-15 DIAGNOSIS — D689 Coagulation defect, unspecified: Secondary | ICD-10-CM | POA: Diagnosis not present

## 2021-07-15 DIAGNOSIS — F0394 Unspecified dementia, unspecified severity, with anxiety: Secondary | ICD-10-CM | POA: Diagnosis present

## 2021-07-15 DIAGNOSIS — Z95828 Presence of other vascular implants and grafts: Secondary | ICD-10-CM

## 2021-07-15 DIAGNOSIS — Z794 Long term (current) use of insulin: Secondary | ICD-10-CM

## 2021-07-15 DIAGNOSIS — I469 Cardiac arrest, cause unspecified: Secondary | ICD-10-CM | POA: Diagnosis not present

## 2021-07-15 DIAGNOSIS — I5021 Acute systolic (congestive) heart failure: Secondary | ICD-10-CM | POA: Diagnosis not present

## 2021-07-15 DIAGNOSIS — M7981 Nontraumatic hematoma of soft tissue: Secondary | ICD-10-CM | POA: Diagnosis not present

## 2021-07-15 DIAGNOSIS — I70229 Atherosclerosis of native arteries of extremities with rest pain, unspecified extremity: Secondary | ICD-10-CM

## 2021-07-15 DIAGNOSIS — Z7902 Long term (current) use of antithrombotics/antiplatelets: Secondary | ICD-10-CM

## 2021-07-15 DIAGNOSIS — I444 Left anterior fascicular block: Secondary | ICD-10-CM | POA: Diagnosis present

## 2021-07-15 DIAGNOSIS — I70222 Atherosclerosis of native arteries of extremities with rest pain, left leg: Secondary | ICD-10-CM | POA: Diagnosis present

## 2021-07-15 DIAGNOSIS — L97519 Non-pressure chronic ulcer of other part of right foot with unspecified severity: Secondary | ICD-10-CM | POA: Diagnosis present

## 2021-07-15 HISTORY — PX: LOWER EXTREMITY ANGIOGRAPHY: CATH118251

## 2021-07-15 LAB — CREATININE, SERUM
Creatinine, Ser: 0.51 mg/dL (ref 0.44–1.00)
GFR, Estimated: >60 mL/min (ref >60)

## 2021-07-15 LAB — CBC WITH DIFFERENTIAL/PLATELET
Abs Immature Granulocytes: 0.05 10*3/uL (ref 0.00–0.07)
Basophils Absolute: 0.1 10*3/uL (ref 0.0–0.1)
Basophils Relative: 0 %
Eosinophils Absolute: 0 10*3/uL (ref 0.0–0.5)
Eosinophils Relative: 0 %
HCT: 27.2 % — ABNORMAL LOW (ref 36.0–46.0)
Hemoglobin: 9.1 g/dL — ABNORMAL LOW (ref 12.0–15.0)
Immature Granulocytes: 0 %
Lymphocytes Relative: 11 %
Lymphs Abs: 1.3 10*3/uL (ref 0.7–4.0)
MCH: 30.8 pg (ref 26.0–34.0)
MCHC: 33.5 g/dL (ref 30.0–36.0)
MCV: 92.2 fL (ref 80.0–100.0)
Monocytes Absolute: 0.6 10*3/uL (ref 0.1–1.0)
Monocytes Relative: 5 %
Neutro Abs: 10.1 10*3/uL — ABNORMAL HIGH (ref 1.7–7.7)
Neutrophils Relative %: 84 %
Platelets: 292 10*3/uL (ref 150–400)
RBC: 2.95 MIL/uL — ABNORMAL LOW (ref 3.87–5.11)
RDW: 13.4 % (ref 11.5–15.5)
WBC: 12.1 10*3/uL — ABNORMAL HIGH (ref 4.0–10.5)
nRBC: 0 % (ref 0.0–0.2)

## 2021-07-15 LAB — DIC (DISSEMINATED INTRAVASCULAR COAGULATION)PANEL
D-Dimer, Quant: >20 ug/mL-FEU — ABNORMAL HIGH (ref 0.00–0.50)
Fibrinogen: 287 mg/dL (ref 210–475)
INR: 1.4 — ABNORMAL HIGH (ref 0.8–1.2)
Platelets: 283 10*3/uL (ref 150–400)
Prothrombin Time: 16.7 seconds — ABNORMAL HIGH (ref 11.4–15.2)
aPTT: 38 seconds — ABNORMAL HIGH (ref 24–36)

## 2021-07-15 LAB — RESP PANEL BY RT-PCR (FLU A&B, COVID) ARPGX2
Influenza A by PCR: NEGATIVE
Influenza B by PCR: NEGATIVE
SARS Coronavirus 2 by RT PCR: NEGATIVE

## 2021-07-15 LAB — ABO/RH: ABO/RH(D): O POS

## 2021-07-15 LAB — MRSA NEXT GEN BY PCR, NASAL: MRSA by PCR Next Gen: NOT DETECTED

## 2021-07-15 LAB — GLUCOSE, CAPILLARY
Glucose-Capillary: 102 mg/dL — ABNORMAL HIGH (ref 70–99)
Glucose-Capillary: 84 mg/dL (ref 70–99)
Glucose-Capillary: 123 mg/dL — ABNORMAL HIGH (ref 70–99)
Glucose-Capillary: 180 mg/dL — ABNORMAL HIGH (ref 70–99)

## 2021-07-15 LAB — BUN: BUN: 9 mg/dL (ref 8–23)

## 2021-07-15 LAB — MASSIVE TRANSFUSION PROTOCOL ORDER (BLOOD BANK NOTIFICATION)

## 2021-07-15 LAB — LACTIC ACID, PLASMA: Lactic Acid, Venous: 6.8 mmol/L (ref 0.5–1.9)

## 2021-07-15 LAB — PREPARE RBC (CROSSMATCH)

## 2021-07-15 SURGERY — LOWER EXTREMITY ANGIOGRAPHY
Anesthesia: Moderate Sedation | Site: Leg Lower | Laterality: Left

## 2021-07-15 MED ORDER — ONDANSETRON HCL 4 MG/2ML IJ SOLN: 4.0000 mg | Freq: Four times a day (QID) | INTRAMUSCULAR | Status: DC | PRN

## 2021-07-15 MED ORDER — ALTEPLASE 2 MG IJ SOLR
INTRAMUSCULAR | Status: DC | PRN
  Administered 2021-07-15: 8 mg

## 2021-07-15 MED ORDER — FENTANYL CITRATE PF 50 MCG/ML IJ SOSY
PREFILLED_SYRINGE | INTRAMUSCULAR | Status: AC
  Filled 2021-07-15: qty 1

## 2021-07-15 MED ORDER — FENTANYL CITRATE PF 50 MCG/ML IJ SOSY
PREFILLED_SYRINGE | INTRAMUSCULAR | Status: AC
  Administered 2021-07-15: 50 ug via INTRAVENOUS
  Filled 2021-07-15: qty 1

## 2021-07-15 MED ORDER — TIROFIBAN (AGGRASTAT) BOLUS VIA INFUSION
25.0000 ug/kg | Freq: Once | INTRAVENOUS | Status: AC
  Administered 2021-07-15: 1247.5 ug via INTRAVENOUS
  Filled 2021-07-15: qty 25

## 2021-07-15 MED ORDER — NOREPINEPHRINE 4 MG/250ML-% IV SOLN
2.0000 ug/min | INTRAVENOUS | Status: DC
  Filled 2021-07-15: qty 250

## 2021-07-15 MED ORDER — SODIUM CHLORIDE 0.9 % IV SOLN: 250.0000 mL | INTRAVENOUS | Status: DC

## 2021-07-15 MED ORDER — FENTANYL CITRATE PF 50 MCG/ML IJ SOSY: 50.0000 ug | PREFILLED_SYRINGE | Freq: Once | INTRAMUSCULAR | Status: AC

## 2021-07-15 MED ORDER — HYDROMORPHONE HCL 1 MG/ML IJ SOLN
1.0000 mg | INTRAMUSCULAR | Status: DC | PRN
  Administered 2021-07-15: 1 mg via INTRAVENOUS
  Filled 2021-07-15: qty 1

## 2021-07-15 MED ORDER — OXYCODONE-ACETAMINOPHEN 5-325 MG PO TABS
1.0000 | ORAL_TABLET | Freq: Four times a day (QID) | ORAL | Status: DC | PRN
  Administered 2021-07-16: 1 via ORAL
  Filled 2021-07-15: qty 1

## 2021-07-15 MED ORDER — MIDAZOLAM HCL 2 MG/2ML IJ SOLN
INTRAMUSCULAR | Status: AC
  Administered 2021-07-15: 1 mg via INTRAVENOUS
  Filled 2021-07-15: qty 2

## 2021-07-15 MED ORDER — ACETAMINOPHEN 325 MG PO TABS: 650.0000 mg | ORAL_TABLET | ORAL | Status: DC | PRN

## 2021-07-15 MED ORDER — SODIUM CHLORIDE 0.9 % IV BOLUS
1000.0000 mL | Freq: Once | INTRAVENOUS | Status: AC
  Administered 2021-07-15: 1000 mL via INTRAVENOUS

## 2021-07-15 MED ORDER — MIDAZOLAM HCL 2 MG/2ML IJ SOLN
INTRAMUSCULAR | Status: AC
  Filled 2021-07-15: qty 2

## 2021-07-15 MED ORDER — SODIUM CHLORIDE 0.9 % IV SOLN: INTRAVENOUS | Status: AC

## 2021-07-15 MED ORDER — SODIUM CHLORIDE 0.9% FLUSH: 3.0000 mL | INTRAVENOUS | Status: DC | PRN

## 2021-07-15 MED ORDER — NOREPINEPHRINE 4 MG/250ML-% IV SOLN: 0.0000 ug/min | INTRAVENOUS | Status: DC

## 2021-07-15 MED ORDER — TRAZODONE HCL 50 MG PO TABS
50.0000 mg | ORAL_TABLET | Freq: Every day | ORAL | Status: DC
  Filled 2021-07-15: qty 1

## 2021-07-15 MED ORDER — FENTANYL CITRATE (PF) 100 MCG/2ML IJ SOLN
INTRAMUSCULAR | Status: DC | PRN
  Administered 2021-07-15: 25 ug via INTRAVENOUS
  Administered 2021-07-15: 50 ug via INTRAVENOUS

## 2021-07-15 MED ORDER — CHLORHEXIDINE GLUCONATE CLOTH 2 % EX PADS
6.0000 | MEDICATED_PAD | Freq: Every day | CUTANEOUS | Status: DC
  Administered 2021-07-15 – 2021-07-16 (×2): 6 via TOPICAL

## 2021-07-15 MED ORDER — HEPARIN SODIUM (PORCINE) 1000 UNIT/ML IJ SOLN
INTRAMUSCULAR | Status: DC | PRN
  Administered 2021-07-15: 4000 [IU] via INTRAVENOUS

## 2021-07-15 MED ORDER — FENTANYL CITRATE PF 50 MCG/ML IJ SOSY
50.0000 ug | PREFILLED_SYRINGE | Freq: Once | INTRAMUSCULAR | Status: DC
  Filled 2021-07-15: qty 1

## 2021-07-15 MED ORDER — HEPARIN SODIUM (PORCINE) 1000 UNIT/ML IJ SOLN
INTRAMUSCULAR | Status: AC
  Filled 2021-07-15: qty 10

## 2021-07-15 MED ORDER — SODIUM CHLORIDE 0.9 % IV SOLN: INTRAVENOUS | Status: DC

## 2021-07-15 MED ORDER — LACTATED RINGERS IV BOLUS
1000.0000 mL | Freq: Once | INTRAVENOUS | Status: AC
  Administered 2021-07-15: 1000 mL via INTRAVENOUS

## 2021-07-15 MED ORDER — ASPIRIN EC 81 MG PO TBEC
81.0000 mg | DELAYED_RELEASE_TABLET | Freq: Every day | ORAL | Status: DC
  Administered 2021-07-16: 81 mg via ORAL
  Filled 2021-07-15: qty 1

## 2021-07-15 MED ORDER — MIDAZOLAM HCL 2 MG/2ML IJ SOLN: 1.0000 mg | Freq: Once | INTRAMUSCULAR | Status: AC

## 2021-07-15 MED ORDER — TIROFIBAN HCL IV 12.5 MG/250 ML
0.1500 ug/kg/min | INTRAVENOUS | Status: DC
  Filled 2021-07-15 (×2): qty 100

## 2021-07-15 MED ORDER — HYDROMORPHONE HCL 1 MG/ML IJ SOLN
INTRAMUSCULAR | Status: AC
  Administered 2021-07-15: 0.5 mg via INTRAVENOUS
  Filled 2021-07-15: qty 1

## 2021-07-15 MED ORDER — PHENYLEPHRINE CONCENTRATED 100MG/250ML (0.4 MG/ML) INFUSION SIMPLE
0.0000 ug/min | INTRAVENOUS | Status: DC
  Administered 2021-07-15: 20 ug/min via INTRAVENOUS
  Administered 2021-07-16: 160 ug/min via INTRAVENOUS
  Administered 2021-07-17: 30 ug/min via INTRAVENOUS
  Filled 2021-07-15 (×3): qty 250

## 2021-07-15 MED ORDER — LABETALOL HCL 5 MG/ML IV SOLN: 10.0000 mg | INTRAVENOUS | Status: DC | PRN

## 2021-07-15 MED ORDER — ATORVASTATIN CALCIUM 10 MG PO TABS: 10.0000 mg | ORAL_TABLET | Freq: Every day | ORAL | Status: DC

## 2021-07-15 MED ORDER — HYDRALAZINE HCL 20 MG/ML IJ SOLN: 5.0000 mg | INTRAMUSCULAR | Status: DC | PRN

## 2021-07-15 MED ORDER — NOREPINEPHRINE 4 MG/250ML-% IV SOLN
INTRAVENOUS | Status: AC
  Administered 2021-07-15: 7 ug/min via INTRAVENOUS
  Filled 2021-07-15: qty 250

## 2021-07-15 MED ORDER — METHYLPREDNISOLONE SODIUM SUCC 125 MG IJ SOLR: 125.0000 mg | Freq: Once | INTRAMUSCULAR | Status: DC | PRN

## 2021-07-15 MED ORDER — SODIUM CHLORIDE 0.9% IV SOLUTION: Freq: Once | INTRAVENOUS | Status: DC

## 2021-07-15 MED ORDER — SODIUM CHLORIDE 0.9% FLUSH
3.0000 mL | Freq: Two times a day (BID) | INTRAVENOUS | Status: DC
  Administered 2021-07-16 – 2021-07-18 (×3): 3 mL via INTRAVENOUS

## 2021-07-15 MED ORDER — GABAPENTIN 300 MG PO CAPS
300.0000 mg | ORAL_CAPSULE | Freq: Every day | ORAL | Status: DC
  Filled 2021-07-15: qty 1

## 2021-07-15 MED ORDER — APIXABAN 5 MG PO TABS: 5.0000 mg | ORAL_TABLET | Freq: Two times a day (BID) | ORAL | 2 refills | Status: AC

## 2021-07-15 MED ORDER — ATORVASTATIN CALCIUM 10 MG PO TABS
10.0000 mg | ORAL_TABLET | Freq: Every day | ORAL | Status: DC
  Filled 2021-07-15: qty 1

## 2021-07-15 MED ORDER — CEFAZOLIN SODIUM-DEXTROSE 2-4 GM/100ML-% IV SOLN
2.0000 g | Freq: Once | INTRAVENOUS | Status: AC
  Administered 2021-07-15: 2 g via INTRAVENOUS

## 2021-07-15 MED ORDER — TIROFIBAN HCL IV 12.5 MG/250 ML
INTRAVENOUS | Status: AC
  Administered 2021-07-15: 0.15 ug/kg/min via INTRAVENOUS
  Filled 2021-07-15: qty 250

## 2021-07-15 MED ORDER — SODIUM CHLORIDE 0.9 % IV SOLN: 250.0000 mL | INTRAVENOUS | Status: DC | PRN

## 2021-07-15 MED ORDER — MIDAZOLAM HCL 2 MG/2ML IJ SOLN
INTRAMUSCULAR | Status: DC | PRN
  Administered 2021-07-15 (×2): 1 mg via INTRAVENOUS

## 2021-07-15 MED ORDER — HYDROMORPHONE HCL 1 MG/ML IJ SOLN
1.0000 mg | Freq: Once | INTRAMUSCULAR | Status: AC | PRN
  Administered 2021-07-15: 0.5 mg via INTRAVENOUS

## 2021-07-15 MED ORDER — FAMOTIDINE 20 MG PO TABS: 40.0000 mg | ORAL_TABLET | Freq: Once | ORAL | Status: DC | PRN

## 2021-07-15 MED ORDER — DIPHENHYDRAMINE HCL 50 MG/ML IJ SOLN: 50.0000 mg | Freq: Once | INTRAMUSCULAR | Status: DC | PRN

## 2021-07-15 MED ORDER — MIDAZOLAM HCL 2 MG/ML PO SYRP: 8.0000 mg | ORAL_SOLUTION | Freq: Once | ORAL | Status: DC | PRN

## 2021-07-15 SURGICAL SUPPLY — 20 items
BALLN LUTONIX  018 4X60X130 (BALLOONS) ×1
BALLN LUTONIX 018 4X60X130 (BALLOONS) ×1
BALLN ULTRVRSE 2.5X300X150 (BALLOONS) ×2
BALLOON LUTONIX 018 4X60X130 (BALLOONS) IMPLANT
BALLOON ULTRVRSE 2.5X300X150 (BALLOONS) IMPLANT
CATH ANGIO 5F PIGTAIL 65CM (CATHETERS) ×1 IMPLANT
CATH ROTAREX 135 6FR (CATHETERS) ×1 IMPLANT
CATH VERT 5X100 (CATHETERS) ×2 IMPLANT
COVER PROBE U/S 5X48 (MISCELLANEOUS) ×1 IMPLANT
DEVICE SAFEGUARD 24CM (GAUZE/BANDAGES/DRESSINGS) ×1 IMPLANT
DEVICE STARCLOSE SE CLOSURE (Vascular Products) ×1 IMPLANT
GLIDEWIRE ADV .035X260CM (WIRE) ×1 IMPLANT
KIT ENCORE 26 ADVANTAGE (KITS) ×1 IMPLANT
PACK ANGIOGRAPHY (CUSTOM PROCEDURE TRAY) ×2 IMPLANT
SHEATH BRITE TIP 5FRX11 (SHEATH) ×1 IMPLANT
SHEATH PINNACLE ST 6F 45CM (SHEATH) ×1 IMPLANT
SYR MEDRAD MARK 7 150ML (SYRINGE) ×1 IMPLANT
TUBING CONTRAST HIGH PRESS 72 (TUBING) ×1 IMPLANT
WIRE G V18X300CM (WIRE) ×1 IMPLANT
WIRE GUIDERIGHT .035X150 (WIRE) ×1 IMPLANT

## 2021-07-15 NOTE — Progress Notes (Addendum)
Consult for USGPIV for vasopressors. Currently with CL being placed. USGPIV no longer needed. Please reenter consult if USGPIV becomes necessary.

## 2021-07-15 NOTE — Progress Notes (Addendum)
PT noted to have developed hematoma to right groin again, less severe than previous however it is notably more firm that last check. Vascular technician came out to assist in pressure holding/expressing hematoma. Gauze and foam tape pressure dressing applied.

## 2021-07-15 NOTE — Progress Notes (Signed)
MD to bedside to update family

## 2021-07-15 NOTE — Procedures (Signed)
Central Venous Catheter Insertion Procedure Note  Carrie Hernandez  [id number]  06-11-1950  Date:08/02/2021  Time:9:56 PM   Provider Performing:Neesha Langton A Anna Genre   Procedure: Insertion of Non-tunneled Central Venous Catheter(36556) with US guidance (82505)   Indication(s) Medication administration and Difficult access  Consent Unable to obtain consent due to emergent nature of procedure.  Anesthesia Topical only with 1% lidocaine   Timeout Verified patient identification, verified procedure, site/side was marked, verified correct patient position, special equipment/implants available, medications/allergies/relevant history reviewed, required imaging and test results available.  Sterile Technique Maximal sterile technique including full sterile barrier drape, hand hygiene, sterile gown, sterile gloves, mask, hair covering, sterile ultrasound probe cover (if used).  Procedure Description Area of catheter insertion was cleaned with chlorhexidine and draped in sterile fashion.  With real-time ultrasound guidance a central venous catheter was placed into the right internal jugular vein. Nonpulsatile blood flow and easy flushing noted in all ports.  The catheter was sutured in place and sterile dressing applied.  Complications/Tolerance None; patient tolerated the procedure well. Chest X-ray is ordered to verify placement for internal jugular or subclavian cannulation.   Chest x-ray is not ordered for femoral cannulation.  EBL Minimal  Specimen(s) None    Webb Silversmith, DNP, CCRN, FNP-C, AGACNP-BC Acute Care Nurse Practitioner  [facility] Pager: [phone] [place] [facility]

## 2021-07-15 NOTE — Progress Notes (Signed)
EMERGENCY BLOOD PRODUCT NOTE  Compare the patient ID on the blood tag to the patient ID on the hospital armband and Blood Bank armband. Then confirm the unit number on the blood tag matches the unit number on the blood product.  If a discrepancy is discovered return the product to blood bank immediately.   Blood Product Type:Packed Red Blood Cells   Unit #: (Found on blood product bag, begins with W) [id number]  Product Code #: (Found on blood product bag, begins with E) [id number]  Start Time: 2056  Starting Rate: 120 ml/hr  Rate increase/decreased  (if applicable):      [id number] ml/hr  Rate changed time (if applicable): 99991111   Stop Time: 2136  All Other Documentation should be documented within the Blood Admin Flowsheet per policy.  Trellis Paganini 9:25 PM

## 2021-07-15 NOTE — Progress Notes (Signed)
Upon pt coming out to recovery area, pt was noted to have a hematoma to the right  groin below the PAD. Vascular team and Dr. Lucky Cowboy to bedside to assess. Hematoma expressed out and soft at this time.

## 2021-07-15 NOTE — Progress Notes (Addendum)
PT brought to room 13 in ICU. Groin stable at time of delivery. Updated oncoming nurse of pulse change in right PT and that MD is aware of change and no new orders at this time. Video interpretor brought into room to assist in getting patient settled.

## 2021-07-15 NOTE — Consult Note (Addendum)
NAME:  Carrie Hernandez, MRN:  [id number], DOB:  [birth year], LOS: 0 ADMISSION DATE:  08-05-21, CONSULTATION DATE:  Aug 05, 2021 REFERRING MD:  Festus Barren, MD CHIEF COMPLAINT:  Right Groin Hematoma    HPI  71 y.o Hispanic female with significant PMH of Diabetes Mellitus, Memory Loss and PAD with extensive BLE revascularization who presented to the hospital for elective Left Lower Extremity Angiography.  Hospital Course: Patient underwent Angiography of the Left Lower Extremity Angiography from the right groin femoral approach. Following completion of surgery, the sheath was removed and StarClose closure device was deployed in the right femoral artery with excellent hemostatic result. The patient was taken to the recovery room in stable condition having tolerated the procedure well. Patient was later transferred to the ICU for close monitoring.  On arrival to the ICU, patient was noted with significant right groin pain and swelling.  She was diaphoretic and appeared uncomfortable.  Vital signs were: temperature was 37.1C, the heart rate 139 beats/minute, the blood pressure 76/56 mm Hg, the respiratory rate 23 breaths/minute, and the oxygen saturation 100% on 2L. She was lethargic, moaned intermittently, and responded with one-word answers; symmetric movement in the arms and legs was observed. Manual compression was applied at the groin site for 20 minutes, patient started on IVFs resuscitation and STAT labs obtained. PCCM consulted  Past Medical History  Diabetes Mellitus, Memory Loss and PAD with extensive BLE revascularization  Significant Hospital Events   2/9: Presented  for elective  surgery s/p Angiography of the Left Lower Extremity Angiography from the right groin femoral approach. Transferred to ICU post op.  Consults:  PCCM Vascular  Procedures:  2/9: s/p Angiography of the Left Lower Extremity Angiography from the right groin femoral approach  Significant Diagnostic Tests:  2/9: CTA  abdomen and pelvis>. Large hyperdense collection/hematoma primarily within the subcutaneous soft tissues superficial to the right lower quadrant abdominal wall and rectus, this extends to the right flank region above the level of umbilicus and inferiorly to the proximal thigh with the inferior extent incompletely visualized. There is a lobulated focal hyperdense collection/hematoma in the right groin anterior to the common femoral vessels consistent with groin hematoma. Correlation with sonography could be obtained to assess for pseudoaneurysm.  Micro Data:  2/9: SARS-CoV-2 PCR> negative 2/9: Influenza PCR> negative  Antimicrobials:  None  OBJECTIVE  Blood pressure 101/66, pulse (!) 133, temperature 98.7 F (37.1 C), temperature source Oral, resp. rate 16, height 5\' 4"  (1.626 m), weight 47.6 kg, SpO2 100 %.    FiO2 (%):  [100 %] 100 %  No intake or output data in the 24 hours ending Aug 05, 2021 2208 Filed Weights   08/05/21 1305 August 05, 2021 1910  Weight: 49.9 kg 47.6 kg   Physical Examination  GENERAL: 71 year-old critically ill patient lying in the bed with in pain.  EYES: Pupils equal, round, reactive to light and accommodation. No scleral icterus. Extraocular muscles intact.  HEENT: Head atraumatic, normocephalic. Oropharynx and nasopharynx clear.  NECK:  Supple, no jugular venous distention. No thyroid enlargement, no tenderness.  LUNGS: Normal breath sounds bilaterally, no wheezing, rales,rhonchi or crepitation. No use of accessory muscles of respiration.  CARDIOVASCULAR: S1, S2 normal. No murmurs, rubs, or gallops.  ABDOMEN: Soft, nontender, nondistended. Bowel sounds present. No organomegaly or mass.  EXTREMITIES: No pedal edema, cyanosis, or clubbing. Right groin hematoma/bruising NEUROLOGIC: Cranial nerves II through XII are intact.  Muscle strength 5/5 in all extremities. Sensation intact. Gait not checked.  PSYCHIATRIC: The patient is  alert and oriented x 1.  SKIN: No obvious rash,  lesion, or ulcer.   Labs/imaging that I havepersonally reviewed  (right click and "Reselect all SmartList Selections" daily)     Labs   CBC: Recent Labs  Lab 08/02/2021 2033  WBC 12.1*  NEUTROABS 10.1*  HGB 9.1*  HCT 27.2*  MCV 92.2  PLT 283   292    Basic Metabolic Panel: Recent Labs  Lab 07/14/2021 1305  BUN 9  CREATININE 0.51   GFR: Estimated Creatinine Clearance: 47.8 mL/min (by C-G formula based on SCr of 0.51 mg/dL). Recent Labs  Lab 07/16/2021 2033  WBC 12.1*    Liver Function Tests: No results for input(s): AST, ALT, ALKPHOS, BILITOT, PROT, ALBUMIN in the last 168 hours. No results for input(s): LIPASE, AMYLASE in the last 168 hours. No results for input(s): AMMONIA in the last 168 hours.  ABG No results found for: PHART, PCO2ART, PO2ART, HCO3, TCO2, ACIDBASEDEF, O2SAT   Coagulation Profile: Recent Labs  Lab 08/01/2021 2033  INR 1.4*    Cardiac Enzymes: No results for input(s): CKTOTAL, CKMB, CKMBINDEX, TROPONINI in the last 168 hours.  HbA1C: No results found for: HGBA1C  CBG: Recent Labs  Lab 07/08/2021 1303 07/21/2021 1544 07/08/2021 1700 08/02/2021 1916  GLUCAP 102* 84 123* 180*    Review of Systems:   UNABLE TO ASSESS DUE TO AMS  Past Medical History  She,  has a past medical history of Diabetes mellitus without complication (HCC).   Surgical History    Past Surgical History:  Procedure Laterality Date   LOWER EXTREMITY ANGIOGRAPHY Right 01/11/2021   Procedure: LOWER EXTREMITY ANGIOGRAPHY;  Surgeon: Annice Needy, MD;  Location: [facility] INVASIVE CV LAB;  Service: Cardiovascular;  Laterality: Right;   LOWER EXTREMITY ANGIOGRAPHY Left 04/05/2021   Procedure: LOWER EXTREMITY ANGIOGRAPHY;  Surgeon: Annice Needy, MD;  Location: [facility] INVASIVE CV LAB;  Service: Cardiovascular;  Laterality: Left;     Social History   reports that she has quit smoking. Her smoking use included cigarettes. She smoked an average of .5 packs per day. She has never used  smokeless tobacco. She reports that she does not drink alcohol.   Family History   Her family history includes Diabetes in her maternal aunt, maternal uncle, and mother; Heart attack in her brother; Lung cancer in her brother.   Allergies No Known Allergies   Home Medications  Prior to Admission medications   Medication Sig Start Date End Date Taking? Authorizing Provider  apixaban (ELIQUIS) 5 MG TABS tablet Take 1 tablet (5 mg total) by mouth 2 (two) times daily. 07/21/2021  Yes Annice Needy, MD  aspirin EC 81 MG tablet Take 1 tablet (81 mg total) by mouth daily. 01/11/21  Yes Dew, Marlow Baars, MD  atorvastatin (LIPITOR) 10 MG tablet Take 1 tablet (10 mg total) by mouth daily. 01/11/21 01/11/22 Yes Dew, Marlow Baars, MD  clopidogrel (PLAVIX) 75 MG tablet Take 1 tablet (75 mg total) by mouth daily. 01/11/21  Yes Dew, Marlow Baars, MD  gabapentin (NEURONTIN) 300 MG capsule Take 1 capsule (300 mg total) by mouth at bedtime. 05/05/21  Yes Georgiana Spinner, NP  insulin glargine (LANTUS) 100 UNIT/ML injection 7 units before breakfast Patient taking differently: Inject 7 Units into the skin. 7 units before breakfast 10/02/19 07/25/2021 Yes Triplett, Cari B, FNP  metFORMIN (GLUCOPHAGE) 500 MG tablet Take by mouth. 10/11/19  Yes [provider]  traZODone (DESYREL) 50 MG tablet Take 50 mg by mouth  at bedtime. 11/20/20  Yes [provider]  atorvastatin (LIPITOR) 10 MG tablet Take 1 tablet by mouth daily. 01/11/21 01/11/22  [provider]  BD INSULIN SYRINGE U/F 31G X 5/16" 0.3 ML MISC See admin instructions. 12/14/20   [provider]  glucose blood (PRECISION QID TEST) test strip 1 each (1 strip total) 3 (three) times daily To check blood sugars Dx: E11.9 11/24/20   [provider]  Scheduled Meds:  sodium chloride   Intravenous Once   sodium chloride   Intravenous Once   aspirin EC  81 mg Oral Daily   atorvastatin  10 mg Oral QHS   Chlorhexidine Gluconate Cloth  6 each Topical Q0600    fentaNYL       fentaNYL       fentaNYL (SUBLIMAZE) injection  50 mcg Intravenous Once   gabapentin  300 mg Oral QHS   heparin sodium (porcine)       midazolam       sodium chloride flush  3 mL Intravenous Q12H   traZODone  50 mg Oral QHS   Continuous Infusions:  sodium chloride     sodium chloride     norepinephrine (LEVOPHED) Adult infusion 7 mcg/min (08/21/2021 2020)   phenylephrine (NEO-SYNEPHRINE) Adult infusion 20 mcg/min (08/21/2021 2326)   tirofiban 0.15 mcg/kg/min (08/21/2021 1841)   PRN Meds:.sodium chloride, acetaminophen, hydrALAZINE, HYDROmorphone (DILAUDID) injection, labetalol, ondansetron (ZOFRAN) IV, ondansetron (ZOFRAN) IV, oxyCODONE-acetaminophen, sodium chloride flush   Active Hospital Problem list     Assessment & Plan:  Acute Blood Loss Anemia Secondary to Right Groin Hematoma post sheath removal Hemorrhagic Shock -Supplemental O2 as needed to maintain O2 saturations >92% -STAT CT abdomen/Pelvis negative for obvious pseudoaneurysm or retroperitoneal hematoma, -IVF resuscitation and Vasopressors to maintain MAP>65 -H&H monitoring q6h -Obtain STAT CBC, CMP, DIC Panel, Lactate -Transfuse PRN Hgb<7 -NPO for now -Hold NSAIDs, steroids, ASA  PAD with rest pain left lower extremity S/p Lower Extremity Angiography complicated by right groin Hematoma post sheath removal PMHx PAD with extensive BLE revascularization -Post direct proximal hemostatic pressure x 20 minutes -Hold Tirofiban (Aggrastat) -PRN pain management -Management per Vascular,discussed CT findings with vascular on call, per Dr. Wyn Quakerew no urgent need for intervention, continue to apply pressure with possible return to OR in the am.   Acute Encephalopathy multifactorial in the setting of above and underlying Dementia -Provide supportive care   Diabetes mellitus Diabetic Peripheral Neuropathy -CBGs -Sliding scale insulin -Follow ICU hyper/hypoglycemia protocol -Hold Gabapentin   Anxiety and  Depression -Hold trazodone while NPO    Due to language barrier, an interpreter was present during the history-taking and subsequent discussion (and for part of the physical exam) with this patient.   Best practice:  Diet:  NPO Pain/Anxiety/Delirium protocol (if indicated): No VAP protocol (if indicated): Not indicated DVT prophylaxis: Contraindicated GI prophylaxis: PPI Glucose control:  SSI Yes Central venous access:  Yes, and it is still needed Arterial line:  N/A Foley:  Yes, and it is still needed Mobility:  bed rest  PT consulted: N/A Last date of multidisciplinary goals of care discussion [2/9] Code Status:  full code Disposition: ICU   = Goals of Care = Code Status Order: FULL  Primary Emergency Contact: Wasser,Ana updated patient's family at the bedside Wishes to pursue full aggressive treatment and intervention options, including CPR and intubation, but goals of care will be addressed on going with family if that should become necessary.  Critical care time: 45 minutes  Webb Silversmith, DNP, CCRN, FNP-C, AGACNP-BC Acute Care Nurse Practitioner  [facility] Pager: [phone] [facility]  .

## 2021-07-15 NOTE — Op Note (Signed)
[facility]  Percutaneous Study/Intervention Procedural Note   Date of Surgery: 07/17/2021  Surgeon(s):Siaosi Alter    Assistants:none  Pre-operative Diagnosis: PAD with rest Hernandez left lower extremity  Post-operative diagnosis:  Same  Procedure(s) Performed:             1.  Ultrasound guidance for vascular access right femoral artery             2.  Catheter placement into left common femoral artery from right femoral approach             3.  Aortogram and selective left lower extremity angiogram             4.  Percutaneous transluminal angioplasty of left anterior tibial artery with 2.5 mm diameter angioplasty balloon             5.  Catheter directed thrombolytic therapy with 8 mg of tPA in the left SFA and popliteal artery  6.  Mechanical thrombectomy of the left SFA and popliteal arteries with the [place] Rex device  7.  Percutaneous transluminal angioplasty of left proximal SFA with 4 mm diameter by 8 cm length Lutonix drug-coated angioplasty balloon             8.  StarClose closure device right femoral artery  EBL: 100 cc  Contrast: 45 cc  Fluoro Time: 5.7 minutes  Moderate Conscious Sedation Time: approximately 35 minutes using 2 mg of Versed and 75 mcg of Fentanyl              Indications:  Patient is a 71 y.o.female with a history of previous extensive arterial revascularization of both lower extremities last year.  She was normal a couple of months ago, but came into the office with profound ischemia and ABIs in the 0.3 range bilaterally and rest Hernandez bilaterally with a small ulcer on the right fourth toe.  The left leg hurt her worse. The patient is brought in for angiography for further evaluation and potential treatment.  Due to the limb threatening nature of the situation, angiogram was performed for attempted limb salvage. The patient is aware that if the procedure fails, amputation would be expected.  The patient also understands that even with  successful revascularization, amputation may still be required due to the severity of the situation.  Risks and benefits are discussed and informed consent is obtained.   Procedure:  The patient was identified and appropriate procedural time out was performed.  The patient was then placed supine on the table and prepped and draped in the usual sterile fashion. Moderate conscious sedation was administered during a face to face encounter with the patient throughout the procedure with my supervision of the RN administering medicines and monitoring the patient's vital signs, pulse oximetry, telemetry and mental status throughout from the start of the procedure until the patient was taken to the recovery room. Ultrasound was used to evaluate the right common femoral artery.  It was patent .  A digital ultrasound image was acquired.  A Seldinger needle was used to access the right common femoral artery under direct ultrasound guidance and a permanent image was performed.  A 0.035 J wire was advanced without resistance and a 5Fr sheath was placed.  Pigtail catheter was placed into the aorta and an AP aortogram was performed. This demonstrated that the left renal artery appears to have at least a moderate stenosis.  Right renal artery is widely patent.  The aorta and iliac arteries  are mildly irregular but not highly stenotic. I then crossed the aortic bifurcation and advanced to the left femoral head. Selective left lower extremity angiogram was then performed. This demonstrated a near flush occlusion of the left SFA with complete occlusion of the left SFA and popliteal artery through the previously placed stents.  The origins of all 3 tibial vessels were occluded with reconstitution of all 3 tibial vessels after proximal occlusion. It was felt that it was in the patient's best interest to proceed with intervention after these images to avoid a second procedure and a larger amount of contrast and fluoroscopy based off of  the findings from the initial angiogram. The patient was systemically heparinized and a 6 [place] destination sheath was then placed over the Terumo Advantage wire. I then used a Kumpe catheter and the advantage wire to easily get into the left SFA occlusion and crossed the lesion without difficulty after instilling 8 mg of tPA through a Kumpe catheter and the left SFA and popliteal arteries.  I exchanged for a V18 wire.  2 passes were then made with the [place] Rex device in the left SFA and popliteal arteries with a large amount of thrombus removed.  The wire was parked in the distal anterior tibial artery and to treat the proximal occlusion in the left anterior tibial artery, 2.5 mm diameter by 30 cm length angioplasty balloon was inflated from the distal popliteal artery down to the mid anterior tibial artery and inflated up to 8 atm for 1 minute.  Completion imaging showed less than 20% residual stenosis in the anterior tibial artery, popliteal artery, and the previously placed SFA and popliteal stents.  There was a residual stenosis of greater than 50% just above the previously placed stents and I elected to treat this area with a 4 mm diameter by 8 cm length Lutonix drug-coated angioplasty balloon inflated up to 10 atm for 1 minute.  Completion imaging showed only about a 20% residual stenosis. I elected to terminate the procedure. The sheath was removed and StarClose closure device was deployed in the right femoral artery with excellent hemostatic result. The patient was taken to the recovery room in stable condition having tolerated the procedure well.  Findings:               Aortogram: Left renal artery appears to have at least a moderate stenosis.  Right renal artery is widely patent.  The aorta and iliac arteries are mildly irregular but not highly stenotic.             Left lower Extremity:  This demonstrated a near flush occlusion of the left SFA with complete occlusion of the left SFA and popliteal  artery through the previously placed stents.  The origins of all 3 tibial vessels were occluded with reconstitution of all 3 tibial vessels after proximal occlusion.   Disposition: Patient was taken to the recovery room in stable condition having tolerated the procedure well.  Complications: None  Carrie Hernandez 07/21/2021 4:43 PM   This note was created with Dragon Medical transcription system. Any errors in dictation are purely unintentional.

## 2021-07-15 NOTE — Interval H&P Note (Signed)
History and Physical Interval Note:  2021-08-10 1:44 PM  Carrie Hernandez  has presented today for surgery, with the diagnosis of LLE Angio   BARD   ASO w rest pain.  The various methods of treatment have been discussed with the patient and family. After consideration of risks, benefits and other options for treatment, the patient has consented to  Procedure(s): Lower Extremity Angiography (Left) as a surgical intervention.  The patient's history has been reviewed, patient examined, no change in status, stable for surgery.  I have reviewed the patient's chart and labs.  Questions were answered to the patient's satisfaction.     Festus Barren

## 2021-07-16 ENCOUNTER — Inpatient Hospital Stay: Payer: Medicare Other

## 2021-07-16 ENCOUNTER — Encounter: Payer: Self-pay | Admitting: Vascular Surgery

## 2021-07-16 ENCOUNTER — Other Ambulatory Visit: Payer: Self-pay

## 2021-07-16 DIAGNOSIS — D6489 Other specified anemias: Secondary | ICD-10-CM | POA: Diagnosis not present

## 2021-07-16 DIAGNOSIS — I5021 Acute systolic (congestive) heart failure: Secondary | ICD-10-CM | POA: Diagnosis not present

## 2021-07-16 DIAGNOSIS — R778 Other specified abnormalities of plasma proteins: Secondary | ICD-10-CM

## 2021-07-16 DIAGNOSIS — I469 Cardiac arrest, cause unspecified: Secondary | ICD-10-CM | POA: Diagnosis not present

## 2021-07-16 DIAGNOSIS — I998 Other disorder of circulatory system: Secondary | ICD-10-CM | POA: Diagnosis not present

## 2021-07-16 LAB — BASIC METABOLIC PANEL
Anion gap: 12 (ref 5–15)
Anion gap: 12 (ref 5–15)
Anion gap: 14 (ref 5–15)
BUN: 15 mg/dL (ref 8–23)
BUN: 20 mg/dL (ref 8–23)
BUN: 22 mg/dL (ref 8–23)
CO2: 18 mmol/L — ABNORMAL LOW (ref 22–32)
CO2: 19 mmol/L — ABNORMAL LOW (ref 22–32)
CO2: 18 mmol/L — ABNORMAL LOW (ref 22–32)
Calcium: 7.5 mg/dL — ABNORMAL LOW (ref 8.9–10.3)
Calcium: 7.6 mg/dL — ABNORMAL LOW (ref 8.9–10.3)
Calcium: 7.6 mg/dL — ABNORMAL LOW (ref 8.9–10.3)
Chloride: 105 mmol/L (ref 98–111)
Chloride: 103 mmol/L (ref 98–111)
Chloride: 104 mmol/L (ref 98–111)
Creatinine, Ser: 0.9 mg/dL (ref 0.44–1.00)
Creatinine, Ser: 0.96 mg/dL (ref 0.44–1.00)
Creatinine, Ser: 0.8 mg/dL (ref 0.44–1.00)
GFR, Estimated: >60 mL/min (ref >60)
GFR, Estimated: >60 mL/min (ref >60)
GFR, Estimated: >60 mL/min (ref >60)
Glucose, Bld: 292 mg/dL — ABNORMAL HIGH (ref 70–99)
Glucose, Bld: 318 mg/dL — ABNORMAL HIGH (ref 70–99)
Glucose, Bld: 326 mg/dL — ABNORMAL HIGH (ref 70–99)
Potassium: 4.4 mmol/L (ref 3.5–5.1)
Potassium: 5.4 mmol/L — ABNORMAL HIGH (ref 3.5–5.1)
Potassium: 5.1 mmol/L (ref 3.5–5.1)
Sodium: 135 mmol/L (ref 135–145)
Sodium: 134 mmol/L — ABNORMAL LOW (ref 135–145)
Sodium: 136 mmol/L (ref 135–145)

## 2021-07-16 LAB — BLOOD GAS, ARTERIAL
Acid-base deficit: 11.2 mmol/L — ABNORMAL HIGH (ref 0.0–2.0)
Bicarbonate: 14.7 mmol/L — ABNORMAL LOW (ref 20.0–28.0)
FIO2: 40
MECHVT: 400 mL
Mechanical Rate: 16
O2 Saturation: 92.9 %
PEEP: 5 cmH2O
Patient temperature: 37
RATE: 20 resp/min
pCO2 arterial: 32 mmHg (ref 32.0–48.0)
pH, Arterial: 7.27 — ABNORMAL LOW (ref 7.350–7.450)
pO2, Arterial: 76 mmHg — ABNORMAL LOW (ref 83.0–108.0)

## 2021-07-16 LAB — CBC
HCT: 27.5 % — ABNORMAL LOW (ref 36.0–46.0)
Hemoglobin: 9.3 g/dL — ABNORMAL LOW (ref 12.0–15.0)
MCH: 31.1 pg (ref 26.0–34.0)
MCHC: 33.8 g/dL (ref 30.0–36.0)
MCV: 92 fL (ref 80.0–100.0)
Platelets: 215 10*3/uL (ref 150–400)
RBC: 2.99 MIL/uL — ABNORMAL LOW (ref 3.87–5.11)
RDW: 15 % (ref 11.5–15.5)
WBC: 20.7 10*3/uL — ABNORMAL HIGH (ref 4.0–10.5)
nRBC: 0 % (ref 0.0–0.2)

## 2021-07-16 LAB — SEDIMENTATION RATE: Sed Rate: 6 mm/hr (ref 0–30)

## 2021-07-16 LAB — MAGNESIUM
Magnesium: 1.8 mg/dL (ref 1.7–2.4)
Magnesium: 2.3 mg/dL (ref 1.7–2.4)

## 2021-07-16 LAB — GLUCOSE, CAPILLARY
Glucose-Capillary: 297 mg/dL — ABNORMAL HIGH (ref 70–99)
Glucose-Capillary: 293 mg/dL — ABNORMAL HIGH (ref 70–99)
Glucose-Capillary: 210 mg/dL — ABNORMAL HIGH (ref 70–99)
Glucose-Capillary: 186 mg/dL — ABNORMAL HIGH (ref 70–99)
Glucose-Capillary: 178 mg/dL — ABNORMAL HIGH (ref 70–99)

## 2021-07-16 LAB — URINALYSIS, COMPLETE (UACMP) WITH MICROSCOPIC
Bilirubin Urine: NEGATIVE
Glucose, UA: 50 mg/dL — AB
Hgb urine dipstick: NEGATIVE
Ketones, ur: 20 mg/dL — AB
Nitrite: NEGATIVE
Protein, ur: 30 mg/dL — AB
Specific Gravity, Urine: >1.046 — ABNORMAL HIGH (ref 1.005–1.030)
WBC, UA: >50 WBC/hpf — ABNORMAL HIGH (ref 0–5)
pH: 5 (ref 5.0–8.0)

## 2021-07-16 LAB — COMPREHENSIVE METABOLIC PANEL
ALT: 409 U/L — ABNORMAL HIGH (ref 0–44)
AST: 459 U/L — ABNORMAL HIGH (ref 15–41)
Albumin: 1.7 g/dL — ABNORMAL LOW (ref 3.5–5.0)
Alkaline Phosphatase: 50 U/L (ref 38–126)
Anion gap: 21 — ABNORMAL HIGH (ref 5–15)
BUN: 25 mg/dL — ABNORMAL HIGH (ref 8–23)
CO2: 17 mmol/L — ABNORMAL LOW (ref 22–32)
Calcium: 7.8 mg/dL — ABNORMAL LOW (ref 8.9–10.3)
Chloride: 104 mmol/L (ref 98–111)
Creatinine, Ser: 1.11 mg/dL — ABNORMAL HIGH (ref 0.44–1.00)
GFR, Estimated: 53 mL/min — ABNORMAL LOW (ref >60)
Glucose, Bld: 251 mg/dL — ABNORMAL HIGH (ref 70–99)
Potassium: 4.5 mmol/L (ref 3.5–5.1)
Sodium: 142 mmol/L (ref 135–145)
Total Bilirubin: 0.4 mg/dL (ref 0.3–1.2)
Total Protein: <3 g/dL — ABNORMAL LOW (ref 6.5–8.1)

## 2021-07-16 LAB — LACTIC ACID, PLASMA
Lactic Acid, Venous: 4.7 mmol/L (ref 0.5–1.9)
Lactic Acid, Venous: 6.5 mmol/L (ref 0.5–1.9)
Lactic Acid, Venous: 3.7 mmol/L (ref 0.5–1.9)
Lactic Acid, Venous: >9 mmol/L (ref 0.5–1.9)
Lactic Acid, Venous: >9 mmol/L (ref 0.5–1.9)
Lactic Acid, Venous: 7.6 mmol/L (ref 0.5–1.9)

## 2021-07-16 LAB — TROPONIN I (HIGH SENSITIVITY)
Troponin I (High Sensitivity): 177 ng/L (ref <18)
Troponin I (High Sensitivity): 208 ng/L (ref <18)
Troponin I (High Sensitivity): 598 ng/L (ref <18)
Troponin I (High Sensitivity): 3635 ng/L (ref <18)
Troponin I (High Sensitivity): 13952 ng/L (ref <18)
Troponin I (High Sensitivity): >24000 ng/L (ref <18)

## 2021-07-16 LAB — HEMOGLOBIN A1C
Hgb A1c MFr Bld: 7.7 % — ABNORMAL HIGH (ref 4.8–5.6)
Mean Plasma Glucose: 174.29 mg/dL

## 2021-07-16 LAB — PHOSPHORUS: Phosphorus: 7.8 mg/dL — ABNORMAL HIGH (ref 2.5–4.6)

## 2021-07-16 LAB — HEMOGLOBIN AND HEMATOCRIT, BLOOD
HCT: 28.1 % — ABNORMAL LOW (ref 36.0–46.0)
HCT: 26.5 % — ABNORMAL LOW (ref 36.0–46.0)
HCT: 26.3 % — ABNORMAL LOW (ref 36.0–46.0)
HCT: 25 % — ABNORMAL LOW (ref 36.0–46.0)
Hemoglobin: 9.4 g/dL — ABNORMAL LOW (ref 12.0–15.0)
Hemoglobin: 9 g/dL — ABNORMAL LOW (ref 12.0–15.0)
Hemoglobin: 8.6 g/dL — ABNORMAL LOW (ref 12.0–15.0)
Hemoglobin: 8.2 g/dL — ABNORMAL LOW (ref 12.0–15.0)

## 2021-07-16 LAB — TSH: TSH: 0.669 u[IU]/mL (ref 0.350–4.500)

## 2021-07-16 LAB — T4, FREE: Free T4: 1.24 ng/dL — ABNORMAL HIGH (ref 0.61–1.12)

## 2021-07-16 LAB — PREPARE RBC (CROSSMATCH)

## 2021-07-16 MED ORDER — METOPROLOL TARTRATE 5 MG/5ML IV SOLN
INTRAVENOUS | Status: AC
  Filled 2021-07-16: qty 5

## 2021-07-16 MED ORDER — DIGOXIN 0.25 MG/ML IJ SOLN: 0.5000 mg | Freq: Once | INTRAMUSCULAR | Status: AC

## 2021-07-16 MED ORDER — MORPHINE SULFATE (PF) 2 MG/ML IV SOLN
INTRAVENOUS | Status: AC
  Administered 2021-07-16: 2 mg via INTRAVENOUS
  Filled 2021-07-16: qty 1

## 2021-07-16 MED ORDER — AMIODARONE HCL IN DEXTROSE 360-4.14 MG/200ML-% IV SOLN: 30.0000 mg/h | INTRAVENOUS | Status: DC

## 2021-07-16 MED ORDER — ADENOSINE 12 MG/4ML IV SOLN
INTRAVENOUS | Status: AC
  Administered 2021-07-16: 6 mg via INTRAVENOUS
  Filled 2021-07-16: qty 4

## 2021-07-16 MED ORDER — SODIUM CHLORIDE 0.9 % IV SOLN: INTRAVENOUS | Status: DC

## 2021-07-16 MED ORDER — NOREPINEPHRINE 4 MG/250ML-% IV SOLN
0.0000 ug/min | INTRAVENOUS | Status: DC
  Administered 2021-07-16: 10 ug/min via INTRAVENOUS
  Administered 2021-07-17: 8 ug/min via INTRAVENOUS
  Filled 2021-07-16 (×2): qty 250

## 2021-07-16 MED ORDER — SODIUM CHLORIDE 0.9 % IV SOLN
1.0000 g | INTRAVENOUS | Status: DC
  Administered 2021-07-16 – 2021-07-18 (×3): 1 g via INTRAVENOUS
  Filled 2021-07-16 (×3): qty 10
  Filled 2021-07-16: qty 1

## 2021-07-16 MED ORDER — MIDAZOLAM HCL 2 MG/2ML IJ SOLN
INTRAMUSCULAR | Status: AC
  Filled 2021-07-16: qty 2

## 2021-07-16 MED ORDER — NOREPINEPHRINE 4 MG/250ML-% IV SOLN
INTRAVENOUS | Status: AC
  Administered 2021-07-16: 10 ug/min via INTRAVENOUS
  Filled 2021-07-16: qty 250

## 2021-07-16 MED ORDER — INSULIN ASPART 100 UNIT/ML IJ SOLN
0.0000 [IU] | INTRAMUSCULAR | Status: DC
  Administered 2021-07-16: 5 [IU] via SUBCUTANEOUS

## 2021-07-16 MED ORDER — IOHEXOL 350 MG/ML SOLN
75.0000 mL | Freq: Once | INTRAVENOUS | Status: AC | PRN
  Administered 2021-07-17: 75 mL via INTRAVENOUS

## 2021-07-16 MED ORDER — INSULIN GLARGINE-YFGN 100 UNIT/ML ~~LOC~~ SOLN
10.0000 [IU] | Freq: Two times a day (BID) | SUBCUTANEOUS | Status: DC
  Administered 2021-07-16 (×2): 10 [IU] via SUBCUTANEOUS
  Filled 2021-07-16 (×5): qty 0.1

## 2021-07-16 MED ORDER — DIGOXIN 0.25 MG/ML IJ SOLN
INTRAMUSCULAR | Status: AC
  Administered 2021-07-16: 0.5 mg via INTRAVENOUS
  Filled 2021-07-16: qty 2

## 2021-07-16 MED ORDER — ATORVASTATIN CALCIUM 10 MG PO TABS: 10.0000 mg | ORAL_TABLET | Freq: Every day | ORAL | Status: DC

## 2021-07-16 MED ORDER — STERILE WATER FOR INJECTION IV SOLN
INTRAVENOUS | Status: DC
  Filled 2021-07-16: qty 150
  Filled 2021-07-16 (×3): qty 1000
  Filled 2021-07-16: qty 150
  Filled 2021-07-16: qty 1000
  Filled 2021-07-16 (×2): qty 150
  Filled 2021-07-16: qty 1000

## 2021-07-16 MED ORDER — SODIUM CHLORIDE 0.9 % IV BOLUS
1000.0000 mL | Freq: Once | INTRAVENOUS | Status: AC
  Administered 2021-07-16: 1000 mL via INTRAVENOUS

## 2021-07-16 MED ORDER — AMIODARONE HCL IN DEXTROSE 360-4.14 MG/200ML-% IV SOLN
INTRAVENOUS | Status: AC
  Administered 2021-07-16: 30 mg/h via INTRAVENOUS
  Filled 2021-07-16: qty 200

## 2021-07-16 MED ORDER — FENTANYL CITRATE PF 50 MCG/ML IJ SOSY
25.0000 ug | PREFILLED_SYRINGE | INTRAMUSCULAR | Status: DC | PRN
  Administered 2021-07-16: 25 ug via INTRAVENOUS
  Filled 2021-07-16: qty 1

## 2021-07-16 MED ORDER — ALBUMIN HUMAN 25 % IV SOLN
25.0000 g | Freq: Four times a day (QID) | INTRAVENOUS | Status: AC
  Administered 2021-07-16 – 2021-07-17 (×4): 25 g via INTRAVENOUS
  Filled 2021-07-16 (×4): qty 100

## 2021-07-16 MED ORDER — ADENOSINE 12 MG/4ML IV SOLN: 6.0000 mg | Freq: Once | INTRAVENOUS | Status: AC

## 2021-07-16 MED ORDER — INSULIN ASPART 100 UNIT/ML IJ SOLN
0.0000 [IU] | INTRAMUSCULAR | Status: DC
  Administered 2021-07-16: 3 [IU] via SUBCUTANEOUS
  Administered 2021-07-16: 5 [IU] via SUBCUTANEOUS
  Administered 2021-07-16: 3 [IU] via SUBCUTANEOUS
  Administered 2021-07-17: 2 [IU] via SUBCUTANEOUS
  Filled 2021-07-16 (×4): qty 1

## 2021-07-16 MED ORDER — LACTATED RINGERS IV BOLUS
1000.0000 mL | Freq: Once | INTRAVENOUS | Status: AC
  Administered 2021-07-17: 1000 mL via INTRAVENOUS

## 2021-07-16 MED ORDER — METOPROLOL TARTRATE 5 MG/5ML IV SOLN: 2.5000 mg | INTRAVENOUS | Status: DC | PRN

## 2021-07-16 MED ORDER — ACETAMINOPHEN 325 MG PO TABS
650.0000 mg | ORAL_TABLET | ORAL | Status: DC | PRN
  Administered 2021-07-17: 650 mg
  Filled 2021-07-16: qty 2

## 2021-07-16 MED ORDER — MAGNESIUM SULFATE 2 GM/50ML IV SOLN
2.0000 g | Freq: Once | INTRAVENOUS | Status: AC
  Administered 2021-07-16: 2 g via INTRAVENOUS
  Filled 2021-07-16: qty 50

## 2021-07-16 MED ORDER — AMIODARONE HCL IN DEXTROSE 360-4.14 MG/200ML-% IV SOLN: 60.0000 mg/h | INTRAVENOUS | Status: DC

## 2021-07-16 MED ORDER — FENTANYL CITRATE PF 50 MCG/ML IJ SOSY
50.0000 ug | PREFILLED_SYRINGE | INTRAMUSCULAR | Status: DC | PRN
  Administered 2021-07-16: 50 ug via INTRAVENOUS

## 2021-07-16 MED ORDER — FENTANYL 2500MCG IN NS 250ML (10MCG/ML) PREMIX INFUSION: 25.0000 ug/h | INTRAVENOUS | Status: DC

## 2021-07-16 MED ORDER — MIDAZOLAM HCL 2 MG/2ML IJ SOLN
2.0000 mg | Freq: Once | INTRAMUSCULAR | Status: AC
  Administered 2021-07-16: 2 mg via INTRAVENOUS

## 2021-07-16 MED ORDER — DOCUSATE SODIUM 50 MG/5ML PO LIQD: 100.0000 mg | Freq: Two times a day (BID) | ORAL | Status: DC

## 2021-07-16 MED ORDER — VASOPRESSIN 20 UNITS/100 ML INFUSION FOR SHOCK
0.0000 [IU]/min | INTRAVENOUS | Status: DC
  Administered 2021-07-16 (×2): 0.03 [IU]/min via INTRAVENOUS
  Filled 2021-07-16 (×3): qty 100

## 2021-07-16 MED ORDER — HYDROMORPHONE HCL 1 MG/ML IJ SOLN
1.0000 mg | INTRAMUSCULAR | Status: DC | PRN
Start: 2021-07-16 — End: 2021-07-16

## 2021-07-16 MED ORDER — INSULIN ASPART 100 UNIT/ML IJ SOLN: 0.0000 [IU] | Freq: Every day | INTRAMUSCULAR | Status: DC

## 2021-07-16 MED ORDER — FENTANYL 2500MCG IN NS 250ML (10MCG/ML) PREMIX INFUSION: 0.0000 ug/h | INTRAVENOUS | Status: DC

## 2021-07-16 MED ORDER — IOHEXOL 350 MG/ML SOLN
100.0000 mL | Freq: Once | INTRAVENOUS | Status: AC | PRN
  Administered 2021-07-16: 100 mL via INTRAVENOUS

## 2021-07-16 MED ORDER — POLYETHYLENE GLYCOL 3350 17 G PO PACK: 17.0000 g | PACK | Freq: Every day | ORAL | Status: DC

## 2021-07-16 MED ORDER — FENTANYL BOLUS VIA INFUSION
25.0000 ug | INTRAVENOUS | Status: DC | PRN
  Filled 2021-07-16: qty 100

## 2021-07-16 MED ORDER — MIDAZOLAM HCL 2 MG/2ML IJ SOLN
1.0000 mg | INTRAMUSCULAR | Status: DC | PRN
  Administered 2021-07-17 – 2021-07-18 (×2): 2 mg via INTRAVENOUS
  Filled 2021-07-16 (×3): qty 2

## 2021-07-16 MED ORDER — FENTANYL 2500MCG IN NS 250ML (10MCG/ML) PREMIX INFUSION
INTRAVENOUS | Status: AC
  Administered 2021-07-16: 200 ug/h via INTRAVENOUS
  Filled 2021-07-16: qty 250

## 2021-07-16 MED ORDER — LACTATED RINGERS IV BOLUS
1000.0000 mL | Freq: Once | INTRAVENOUS | Status: AC
  Administered 2021-07-16: 1000 mL via INTRAVENOUS

## 2021-07-16 MED ORDER — INSULIN ASPART 100 UNIT/ML IJ SOLN
0.0000 [IU] | Freq: Three times a day (TID) | INTRAMUSCULAR | Status: DC
  Administered 2021-07-16: 8 [IU] via SUBCUTANEOUS
  Filled 2021-07-16: qty 1

## 2021-07-16 MED ORDER — SODIUM BICARBONATE 8.4 % IV SOLN
150.0000 meq | Freq: Once | INTRAVENOUS | Status: AC
  Administered 2021-07-16: 150 meq via INTRAVENOUS
  Filled 2021-07-16: qty 150

## 2021-07-16 MED ORDER — MIDAZOLAM HCL 2 MG/2ML IJ SOLN
1.0000 mg | INTRAMUSCULAR | Status: DC | PRN
  Administered 2021-07-18: 1 mg via INTRAVENOUS
  Filled 2021-07-16: qty 2

## 2021-07-16 MED ORDER — SODIUM CHLORIDE 0.9% IV SOLUTION: Freq: Once | INTRAVENOUS | Status: DC

## 2021-07-16 NOTE — Progress Notes (Signed)
°   07/16/21 0700  Clinical Encounter Type  Visited With Patient and family together;Health care provider  Visit Type Post-op  Referral From Nurse  Spiritual Encounters  Spiritual Needs Emotional  Stress Factors  Family Stress Factors Lack of knowledge   Chaplain was called late in the evening to use his Spanish to communicate between nurse and patient's family. Family was quite concerned due to a lack of ability to understand. Chaplain facilitated communication and because of it, relieve the stress due to a lack of understanding.

## 2021-07-16 NOTE — Procedures (Signed)
Endotracheal Intubation: Patient required placement of an artificial airway secondary to unresponsive state   Consent: Emergent.    Hand washing performed prior to starting the procedure.    Medications administered for sedation prior to procedure:  None     A time out procedure was called and correct patient, name, & ID confirmed. Needed supplies and equipment were assembled and checked to include ETT, 10 ml syringe, Glidescope, Mac and Miller blades, suction, oxygen and bag mask valve, end tidal CO2 monitor.    Patient was positioned to align the mouth and pharynx to facilitate visualization of the glottis.    Heart rate, SpO2 and blood pressure was continuously monitored during the procedure. Pre-oxygenation was conducted prior to intubation and endotracheal tube was placed through the vocal cords into the trachea.       The artificial airway was placed under direct visualization via glidescope route using a 8      ETT on the first attempt.   ETT was secured at 24 cm.   Placement was confirmed by auscuitation of lungs with good breath sounds bilaterally and no stomach sounds.  Condensation was noted on endotracheal tube.   Pulse ox 98%  CO2 detector in place with appropriate color change.    Complications: None .    Rosilyn Mings, AGNP  Pulmonary/Critical Care Pager [phone] (please enter 7 digits) PCCM Consult Pager [phone] (please enter 7 digits)    Chest radiograph ordered and pending.

## 2021-07-16 NOTE — Progress Notes (Signed)
[facility]  Daily Progress Note   Subjective  -   Patient had a lot of issues overnight.  Groin and some abdominal wall hematoma which are stable.  Hemoglobin is stable.  She required pressors and volume resuscitation and is on a low-dose of Neo-Synephrine this morning.  Her troponins are up this morning.  She remains tachycardic with blood pressures around 110/60 when I saw her this morning.  No palpable pulses in either foot.  Suspect her left leg intervention rethrombosed with hypotension and having to hold anticoagulation last night.  Objective Vitals:   07/16/21 1000 07/16/21 1015 07/16/21 1030 07/16/21 1045  BP: 118/81 (!) 123/59 (!) 93/56 (!) 90/51  Pulse: (!) 135 (!) 123 (!) 119 (!) 121  Resp: 18 18 (!) 26 (!) 28  Temp:      TempSrc:      SpO2: 100% 100% 100% 100%  Weight:      Height:        Intake/Output Summary (Last 24 hours) at 07/16/2021 1050 Last data filed at 07/16/2021 1000 Gross per 24 hour  Intake 671.5 ml  Output 875 ml  Net -203.5 ml    PULM  CTAB CV  RRR VASC  no palpable pedal pulses on either foot, but feet are stable to her baseline slightly cool with somewhat sluggish capillary refill.  Laboratory CBC    Component Value Date/Time   WBC 20.7 (H) 07/16/2021 0448   HGB 9.0 (L) 07/16/2021 0716   HCT 26.5 (L) 07/16/2021 0716   PLT 215 07/16/2021 0448    BMET    Component Value Date/Time   NA 136 07/16/2021 0716   K 5.1 07/16/2021 0716   CL 104 07/16/2021 0716   CO2 18 (L) 07/16/2021 0716   GLUCOSE 326 (H) 07/16/2021 0716   BUN 22 07/16/2021 0716   CREATININE 0.80 07/16/2021 0716   CALCIUM 7.6 (L) 07/16/2021 0716   GFRNONAA >60 07/16/2021 0716   GFRAA >60 05/12/2016 1816    Assessment/Planning: POD #1 s/p left lower extremity angiogram and intervention  Both legs appear to remain now perfused, but not acutely threatened. Right superficial hematoma without retroperitoneal bleed.  Hemoglobin stable and bleeding  appears to have stopped.  holding anticoagulation. We will hold on angiogram and probably do right leg angiogram Monday.  Right groin hematoma will need to resolve some before we consider reintervention on the left leg.  Terrible peripheral arterial disease at high risk of limb loss. We will ask cardiology to see patient for elevated troponins.  May just be demand ischemia.   Festus Barren  07/16/2021, 10:50 AM

## 2021-07-16 NOTE — TOC Initial Note (Signed)
Transition of Care [facility]) - Initial/Assessment Note    Patient Details  Name: Carrie Hernandez MRN: [id number] Date of Birth: 11-12-1950  Transition of Care [facility]) CM/SW Contact:    Shelbie Hutching, RN Phone Number: 07/16/2021, 11:36 AM  Clinical Narrative:                 Patient admitted to the hospital with atherosclerosis of both lower extremities s/p left lower extremity angio with thrombolytic therapy and thrombectomy.  Per vascular note they may take her back for another angiogram on Monday.  RNCM met with patient at the bedside with [place], 2 of the patient's daughter's at the bedside as well.  Patient reports she has 11 children, all her children rotate taking care of her.  Daughter Myra at the beside handles the patient's healthcare, gets her prescriptions and transports to doctors.   Patient walks with a cane, she is worried about what she will be able to do once she gets back home.   RNCM informed patient and daughter that home health services could be arranged at discharge if needed and any equipment could be ordered such as walker or bedside commode.  TOC will follow and assist with disposition.    Expected Discharge Plan: [place] Barriers to Discharge: Continued Medical Work up   Patient Goals and CMS Choice Patient states their goals for this hospitalization and ongoing recovery are:: to be able to keep going, and wants to know what is happening with her leg. CMS Medicare.gov Compare Post Acute Care list provided to:: Patient Choice offered to / list presented to : Patient, Adult Children  Expected Discharge Plan and Services Expected Discharge Plan: [place] In-house Referral: Interpreting Services Discharge Planning Services: CM Consult Post Acute Care Choice: [place] arrangements for the past 2 months: Single Family Home Expected Discharge Date: 07/12/2021                                     Prior Living Arrangements/Services Living arrangements for the past 2 months: Single Family Home Lives with:: Adult Children Patient language and need for interpreter reviewed:: Yes (Spanish) Do you feel safe going back to the place where you live?: Yes      Need for Family Participation in Patient Care: Yes (Comment) Care giver support system in place?: Yes (comment) (11 children) Current home services: DME (cane) Criminal Activity/Legal Involvement Pertinent to Current Situation/Hospitalization: No - Comment as needed  Activities of Daily Living      Permission Sought/Granted Permission sought to share information with : Case Manager, Family Supports Permission granted to share information with : Yes, Verbal Permission Granted  Share Information with NAME: Myra Klang     Permission granted to share info w Relationship: daughter  Permission granted to share info w Contact Information: [phone]  Emotional Assessment Appearance:: Appears stated age Attitude/Demeanor/Rapport: Engaged Affect (typically observed): Accepting Orientation: : Oriented to Self, Oriented to Place, Oriented to  Time, Oriented to Situation Alcohol / Substance Use: Not Applicable Psych Involvement: No (comment)  Admission diagnosis:  Ischemic leg [I99.8] Patient Active Problem List   Diagnosis Date Noted   Ischemic leg 07/23/2021   Major depressive disorder, recurrent episode, moderate with mixed features ([place]) 05/07/2021   Neuropathy 05/07/2021   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 ([place]) 01/27/2021   Loss of memory 01/22/2020  Type 2 diabetes mellitus without complication, without long-term current use of insulin ([place]) 12/18/2017   Elevated blood pressure reading 12/18/2017   PCP:  [place] [facility]:   [id number] [place] [id number]: [phone]: [phone]    Social  Determinants of Health (SDOH) Interventions    Readmission Risk Interventions Readmission Risk Prevention Plan 07/16/2021  Transportation Screening Complete  PCP or Specialist Appt within 5-7 Days Complete  Home Care Screening Complete  Medication Review (RN CM) Complete  Some recent data might be hidden

## 2021-07-16 NOTE — Progress Notes (Addendum)
NAME:  Carrie Hernandez, MRN:  [id number], DOB:  22-Nov-1950, LOS: 1 ADMISSION DATE:  08/01/2021, CONSULTATION DATE:  07/25/2021 REFERRING MD:  Leotis Pain, MD CHIEF COMPLAINT:  Right Groin Hematoma    HPI  71 y.o Hispanic female with significant PMH of Diabetes Mellitus, Memory Loss and PAD with extensive BLE revascularization who presented to the hospital for elective Left Lower Extremity Angiography.  Hospital Course: Patient underwent Angiography of the Left Lower Extremity Angiography from the right groin femoral approach. Following completion of surgery, the sheath was removed and StarClose closure device was deployed in the right femoral artery with excellent hemostatic result. The patient was taken to the recovery room in stable condition having tolerated the procedure well. Patient was later transferred to the ICU for close monitoring.  On arrival to the ICU, patient was noted with significant right groin pain and swelling.  She was diaphoretic and appeared uncomfortable.  Vital signs were: temperature was 37.1C, the heart rate 139 beats/minute, the blood pressure 76/56 mm Hg, the respiratory rate 23 breaths/minute, and the oxygen saturation 100% on 2L. She was lethargic, moaned intermittently, and responded with one-word answers; symmetric movement in the arms and legs was observed. Manual compression was applied at the groin site for 20 minutes, patient started on IVFs resuscitation and STAT labs obtained. PCCM consulted   07/16/21-patient had cardiac arrest today.  She was in shock and was noted to tachyarrythmia with asystole.  She had ACLS with ROSC and had cardiology consult.   Past Medical History  Diabetes Mellitus, Memory Loss and PAD with extensive BLE revascularization  Significant Hospital Events   2/9: Presented  for elective  surgery s/p Angiography of the Left Lower Extremity Angiography from the right groin femoral approach. Transferred to ICU post op.  Consults:   PCCM Vascular  Procedures:  2/9: s/p Angiography of the Left Lower Extremity Angiography from the right groin femoral approach  Significant Diagnostic Tests:  2/9: CTA abdomen and pelvis>. Large hyperdense collection/hematoma primarily within the subcutaneous soft tissues superficial to the right lower quadrant abdominal wall and rectus, this extends to the right flank region above the level of umbilicus and inferiorly to the proximal thigh with the inferior extent incompletely visualized. There is a lobulated focal hyperdense collection/hematoma in the right groin anterior to the common femoral vessels consistent with groin hematoma. Correlation with sonography could be obtained to assess for pseudoaneurysm.  Micro Data:  2/9: SARS-CoV-2 PCR> negative 2/9: Influenza PCR> negative  Antimicrobials:  None  OBJECTIVE  Blood pressure (!) 112/57, pulse (!) 135, temperature 98.3 F (36.8 C), temperature source Oral, resp. rate (!) 33, height _0  (1.626 m), weight 47.6 kg, SpO2 100 %.    FiO2 (%):  [100 %] 100 %   Intake/Output Summary (Last 24 hours) at 07/16/2021 1449 Last data filed at 07/16/2021 1000 Gross per 24 hour  Intake 771.5 ml  Output 875 ml  Net -103.5 ml   Filed Weights   08/01/2021 1305 07/31/2021 1910  Weight: 49.9 kg 47.6 kg   Physical Examination  GENERAL: 71 year-old critically ill patient lying in the bed with in pain.  EYES: Pupils equal, round, reactive to light and accommodation. No scleral icterus. Extraocular muscles intact.  HEENT: Head atraumatic, normocephalic. Oropharynx and nasopharynx clear.  NECK:  Supple, no jugular venous distention. No thyroid enlargement, no tenderness.  LUNGS: Normal breath sounds bilaterally, no wheezing, rales,rhonchi or crepitation. No use of accessory muscles of respiration.  CARDIOVASCULAR: S1, S2 normal.  No murmurs, rubs, or gallops.  ABDOMEN: Soft, nontender, nondistended. Bowel sounds present. No organomegaly or mass.   EXTREMITIES: No pedal edema, cyanosis, or clubbing. Right groin hematoma/bruising NEUROLOGIC: Cranial nerves II through XII are intact.  Muscle strength 5/5 in all extremities. Sensation intact. Gait not checked.  PSYCHIATRIC: The patient is alert and oriented x 1.  SKIN: No obvious rash, lesion, or ulcer.   Labs/imaging that I havepersonally reviewed  (right click and "Reselect all SmartList Selections" daily)     Labs   CBC: Recent Labs  Lab 07/30/2021 2033 07/16/21 0224 07/16/21 0448 07/16/21 0716  WBC 12.1*  --  20.7*  --   NEUTROABS 10.1*  --   --   --   HGB 9.1* 9.4* 9.3* 9.0*  HCT 27.2* 28.1* 27.5* 26.5*  MCV 92.2  --  92.0  --   PLT 283   292  --  215  --      Basic Metabolic Panel: Recent Labs  Lab 07/23/2021 1305 07/17/2021 2249 07/16/21 0448 07/16/21 0716  NA  --  135 134* 136  K  --  4.4 5.4* 5.1  CL  --  105 103 104  CO2  --  18* 19* 18*  GLUCOSE  --  292* 318* 326*  BUN _0 CREATININE 0.51 0.90 0.96 0.80  CALCIUM  --  7.5* 7.6* 7.6*  MG  --   --  1.8  --     GFR: Estimated Creatinine Clearance: 47.8 mL/min (by C-G formula based on SCr of 0.8 mg/dL). Recent Labs  Lab 07/30/2021 2033 07/17/2021 2249 07/16/21 0448 07/16/21 0716 07/16/21 1001  WBC 12.1*  --  20.7*  --   --   LATICACIDVEN  --  6.8* 4.7* 6.5* 3.7*     Liver Function Tests: No results for input(s): AST, ALT, ALKPHOS, BILITOT, PROT, ALBUMIN in the last 168 hours. No results for input(s): LIPASE, AMYLASE in the last 168 hours. No results for input(s): AMMONIA in the last 168 hours.  ABG    Component Value Date/Time   PHART 6.90 (LL) 07/16/2021 1438   PCO2ART 40 07/16/2021 1438   PO2ART 32 (LL) 07/16/2021 1438   HCO3 7.8 (L) 07/16/2021 1438   ACIDBASEDEF 22.9 (H) 07/16/2021 1438   O2SAT 24.8 07/16/2021 1438     Coagulation Profile: Recent Labs  Lab 07/12/2021 2033  INR 1.4*     Cardiac Enzymes: No results for input(s): CKTOTAL, CKMB, CKMBINDEX, TROPONINI in the last  168 hours.  HbA1C: Hgb A1c MFr Bld  Date/Time Value Ref Range Status  07/16/2021 06:39 AM 7.7 (H) 4.8 - 5.6 % Final    Comment:    (NOTE) Pre diabetes:          5.7%-6.4%  Diabetes:              >6.4%  Glycemic control for   <7.0% adults with diabetes     CBG: Recent Labs  Lab 07/25/2021 1544 07/26/2021 1700 07/22/2021 1916 07/16/21 0751 07/16/21 1152  GLUCAP 84 123* 180* 297* 293*     Review of Systems:   UNABLE TO ASSESS DUE TO AMS  Past Medical History  She,  has a past medical history of Diabetes mellitus without complication ([place]).   Surgical History    Past Surgical History:  Procedure Laterality Date   LOWER EXTREMITY ANGIOGRAPHY Right 01/11/2021   Procedure: LOWER EXTREMITY ANGIOGRAPHY;  Surgeon: Algernon Huxley, MD;  Location: [facility];  Service:  Cardiovascular;  Laterality: Right;   LOWER EXTREMITY ANGIOGRAPHY Left 04/05/2021   Procedure: LOWER EXTREMITY ANGIOGRAPHY;  Surgeon: Algernon Huxley, MD;  Location: [place] [facility];  Service: Cardiovascular;  Laterality: Left;   LOWER EXTREMITY ANGIOGRAPHY Left 07/09/2021   Procedure: Lower Extremity Angiography;  Surgeon: Algernon Huxley, MD;  Location: [facility];  Service: Cardiovascular;  Laterality: Left;     Social History   reports that she has quit smoking. Her smoking use included cigarettes. She smoked an average of .5 packs per day. She has never used smokeless tobacco. She reports that she does not drink alcohol.   Family History   Her family history includes Diabetes in her maternal aunt, maternal uncle, and mother; Heart attack in her brother; Lung cancer in her brother.   Allergies No Known Allergies   Home Medications  Prior to Admission medications   Medication Sig Start Date End Date Taking? Authorizing Provider  apixaban (ELIQUIS) 5 MG TABS tablet Take 1 tablet (5 mg total) by mouth 2 (two) times daily. 07/28/2021  Yes Algernon Huxley, MD  aspirin EC 81 MG tablet Take 1 tablet (81 mg  total) by mouth daily. 01/11/21  Yes Dew, Erskine Squibb, MD  atorvastatin (LIPITOR) 10 MG tablet Take 1 tablet (10 mg total) by mouth daily. 01/11/21 01/11/22 Yes Dew, Erskine Squibb, MD  clopidogrel (PLAVIX) 75 MG tablet Take 1 tablet (75 mg total) by mouth daily. 01/11/21  Yes Dew, Erskine Squibb, MD  gabapentin (NEURONTIN) 300 MG capsule Take 1 capsule (300 mg total) by mouth at bedtime. 05/05/21  Yes Kris Hartmann, NP  insulin glargine (LANTUS) 100 UNIT/ML injection 7 units before breakfast Patient taking differently: Inject 7 Units into the skin. 7 units before breakfast 10/02/19 07/29/2021 Yes Triplett, Cari B, FNP  metFORMIN (GLUCOPHAGE) 500 MG tablet Take by mouth. 10/11/19  Yes [provider]  traZODone (DESYREL) 50 MG tablet Take 50 mg by mouth at bedtime. 11/20/20  Yes [provider]  atorvastatin (LIPITOR) 10 MG tablet Take 1 tablet by mouth daily. 01/11/21 01/11/22  [provider]  BD INSULIN SYRINGE U/F 31G X 5/16" 0.3 ML MISC See admin instructions. 12/14/20   [provider]  glucose blood (PRECISION QID TEST) test strip 1 each (1 strip total) 3 (three) times daily To check blood sugars Dx: E11.9 11/24/20   [provider]  Scheduled Meds:  sodium chloride   Intravenous Once   sodium chloride   Intravenous Once   sodium chloride   Intravenous Once   aspirin EC  81 mg Oral Daily   atorvastatin  10 mg Oral QHS   Chlorhexidine Gluconate Cloth  6 each Topical Q0600   fentaNYL (SUBLIMAZE) injection  50 mcg Intravenous Once   gabapentin  300 mg Oral QHS   insulin aspart  0-15 Units Subcutaneous TID WC   insulin aspart  0-5 Units Subcutaneous QHS   insulin glargine-yfgn  10 Units Subcutaneous BID   sodium bicarbonate  150 mEq Intravenous Once   sodium chloride flush  3 mL Intravenous Q12H   traZODone  50 mg Oral QHS   Continuous Infusions:  sodium chloride     sodium chloride     sodium chloride 100 mL/hr at 07/16/21 1000   amiodarone     amiodarone 30 mg/hr (07/16/21  1356)   cefTRIAXone (ROCEPHIN)  IV Stopped (07/16/21 0650)   fentaNYL infusion INTRAVENOUS 200 mcg/hr (07/16/21 1428)   norepinephrine (LEVOPHED) Adult infusion 10 mcg/min (  07/16/21 1430)   phenylephrine (NEO-SYNEPHRINE) Adult infusion 400 mcg/min (07/16/21 1407)    sodium bicarbonate (isotonic) infusion in sterile water     vasopressin 0.03 Units/min (07/16/21 1426)   PRN Meds:.sodium chloride, acetaminophen, fentaNYL (SUBLIMAZE) injection, fentaNYL (SUBLIMAZE) injection, hydrALAZINE, HYDROmorphone (DILAUDID) injection, metoprolol tartrate, ondansetron (ZOFRAN) IV, ondansetron (ZOFRAN) IV, oxyCODONE-acetaminophen, sodium chloride flush   Active Hospital Problem list     Assessment & Plan:  Acute Blood Loss Anemia Secondary to Right Groin Hematoma post sheath removal Hemorrhagic Shock -Supplemental O2 as needed to maintain O2 saturations >92% -STAT CT abdomen/Pelvis negative for obvious pseudoaneurysm or retroperitoneal hematoma, -IVF resuscitation and Vasopressors to maintain MAP>65 -H&H monitoring q6h -Obtain STAT CBC, CMP, DIC Panel, Lactate -Transfuse PRN Hgb<7 -NPO for now -Hold NSAIDs, steroids, ASA  Cardiac arrest s/p ACLS   Patient had ROSC post CPR    See procedure note for code blue   - patient had bedside TTE - LVEF 20%   -I met with family and discussed in details in spanish to son and daughter the findings and medical plan   - updated vascular surgery -   - reviewed plan with cardiology team - appreciate input  -  s/p LR bolus and ordered empiric blood transfusion   - ABG - pH 6.9 - started NaHCO3   PAD with rest pain left lower extremity S/p Lower Extremity Angiography complicated by right groin Hematoma post sheath removal PMHx PAD with extensive BLE revascularization -Post direct proximal hemostatic pressure x 20 minutes -Hold Tirofiban (Aggrastat) -PRN pain management -Management per Vascular,discussed CT findings with vascular on call, per Dr. Lucky Cowboy no  urgent need for intervention, continue to apply pressure with possible return to OR in the am.   Acute Encephalopathy multifactorial in the setting of above and underlying Dementia -Provide supportive care   Diabetes mellitus Diabetic Peripheral Neuropathy -CBGs -Sliding scale insulin -Follow ICU hyper/hypoglycemia protocol -Hold Gabapentin   Anxiety and Depression -Hold trazodone while NPO    Due to language barrier, an interpreter was present during the history-taking and subsequent discussion (and for part of the physical exam) with this patient.   Best practice:  Diet:  NPO Pain/Anxiety/Delirium protocol (if indicated): No VAP protocol (if indicated): Not indicated DVT prophylaxis: Contraindicated GI prophylaxis: PPI Glucose control:  SSI Yes Central venous access:  Yes, and it is still needed Arterial line:  N/A Foley:  Yes, and it is still needed Mobility:  bed rest  PT consulted: N/A Last date of multidisciplinary goals of care discussion [2/9] Code Status:  full code Disposition: ICU   = Goals of Care = Code Status Order: FULL  Primary Emergency Contact: Polson,Ana updated patient's family at the bedside Wishes to pursue full aggressive treatment and intervention options, including CPR and intubation, but goals of care will be addressed on going with family if that should become necessary.   Critical care provider statement:   Total critical care time: 33 minutes   Performed by: Lanney Gins MD   Critical care time was exclusive of separately billable procedures and treating other patients.   Critical care was necessary to treat or prevent imminent or life-threatening deterioration.   Critical care was time spent personally by me on the following activities: development of treatment plan with patient and/or surrogate as well as nursing, discussions with consultants, evaluation of patient's response to treatment, examination of patient, obtaining history  from patient or surrogate, ordering and performing treatments and interventions, ordering and review of laboratory studies, ordering and review  of radiographic studies, pulse oximetry and re-evaluation of patient's condition.    Ottie Glazier, M.D.  Pulmonary & Critical Care Medicine      .

## 2021-07-16 NOTE — Progress Notes (Signed)
Chaplain Maggie offered hospitality to pt's family as they waited in the hallway outside ICU.

## 2021-07-16 NOTE — Consult Note (Signed)
Cardiology Consultation:   Patient ID: PRINCE NAYYAR; [id number]; [birth year]   Admit date: 07/19/2021 Date of Consult: 07/16/2021  Primary Care Provider: [place] Primary Cardiologist: New to [facility] - consult by End Primary Electrophysiologist:  None   Patient Profile:   Carrie Hernandez is a 71 y.o. female with a hx of PAD with extensive bilateral lower extremity intervention, DM2, prior tobacco use, and memory loss who is being seen today for the evaluation of tachycardia at the request of Dr. Lanney Gins.  History of Present Illness:   Ms. Paolella has no previously known cardiac history.  She was admitted to [facility] on 08/02/2021 for planned lower extremity angiography with vascular surgery via right femoral artery approach.  She underwent angioplasty of the left anterior tibial artery along with catheter directed thrombolytic therapy and mechanical thrombectomy of the left SFA and popliteal arteries, and transluminal angioplasty of the left proximal SFA.  Initially, postprocedure course was complicated by right groin hematoma.  She was transferred to the ICU for close monitoring.  In the ICU, she was noted to have significant right groin pain and swelling.  She was hypotensive with blood pressures in the 70s over 50s and tachycardic with rates in the 130s bpm.  CT of the abdomen/pelvis last evening showed a large hematoma primarily within the subcutaneous soft tissues superficial to the right lower quadrant abdominal wall and rectus that extended to the right flank region above the level of the umbilicus and inferiorly to the proximal thigh with the inferior extent incompletely visualized.  There was also concern for possible pseudoaneurysm.  Hemoglobin trended from a prior baseline around 13 in the spring 2022, per Care Everywhere, to a value around 9.  EKG overnight notable for sinus tachycardia, 133 bpm, nonspecific ST elevation in leads aVR and V1, more pronounced when compared  to prior.  She required vasopressor support and volume resuscitation.  This morning she was maintained on Neo-Synephrine, though remained hypotensive.  Initial high-sensitivity troponin 177 trending to 590 8 in the afternoon hours.  She remained tachycardic into the 130s bpm.  For persistence of narrow complex tachycardia, she was given adenosine, digoxin, metoprolol, and placed on an amiodarone drip.  Review of telemetry is consistent with sinus tachycardia with a short atrial runs.  While cardiology was reviewing the patient's telemetry, the patient became bradycardic followed by loss of pulse and cardiac arrest with CPR and epinephrine administered followed by ROSC.  She was subsequently intubated.  Bedside echo performed by cardiology MD shows vigorous RV systolic function with an approximate LV systolic function of 20 to 25%.  She continues to require vasopressor support with Levophed and vasopressin.  Postcardiac arrest and CPR, high-sensitivity troponin has trended to 3635.  Labs post arrest demonstrate a potassium of 4.5, BUN 25, serum creatinine 1.11, calcium 7.8, albumin 1.7, AST 459, ALT 409, lactic acid greater than 9.  Current Hgb 8.6.    Past Medical History:  Diagnosis Date   Diabetes mellitus without complication [facility])     Past Surgical History:  Procedure Laterality Date   LOWER EXTREMITY ANGIOGRAPHY Right 01/11/2021   Procedure: LOWER EXTREMITY ANGIOGRAPHY;  Surgeon: Algernon Huxley, MD;  Location: [place] CV [facility];  Service: Cardiovascular;  Laterality: Right;   LOWER EXTREMITY ANGIOGRAPHY Left 04/05/2021   Procedure: LOWER EXTREMITY ANGIOGRAPHY;  Surgeon: Algernon Huxley, MD;  Location: [place] [facility];  Service: Cardiovascular;  Laterality: Left;   LOWER EXTREMITY ANGIOGRAPHY Left 07/31/2021  Procedure: Lower Extremity Angiography;  Surgeon: Algernon Huxley, MD;  Location: [place] CV [facility];  Service: Cardiovascular;  Laterality: Left;     Home Meds: Prior to Admission  medications   Medication Sig Start Date End Date Taking? Authorizing Provider  apixaban (ELIQUIS) 5 MG TABS tablet Take 1 tablet (5 mg total) by mouth 2 (two) times daily. 07/27/2021  Yes Algernon Huxley, MD  aspirin EC 81 MG tablet Take 1 tablet (81 mg total) by mouth daily. 01/11/21  Yes Dew, Erskine Squibb, MD  atorvastatin (LIPITOR) 10 MG tablet Take 1 tablet (10 mg total) by mouth daily. 01/11/21 01/11/22 Yes Dew, Erskine Squibb, MD  clopidogrel (PLAVIX) 75 MG tablet Take 1 tablet (75 mg total) by mouth daily. 01/11/21  Yes Dew, Erskine Squibb, MD  gabapentin (NEURONTIN) 300 MG capsule Take 1 capsule (300 mg total) by mouth at bedtime. 05/05/21  Yes Kris Hartmann, NP  insulin glargine (LANTUS) 100 UNIT/ML injection 7 units before breakfast Patient taking differently: Inject 10 Units into the skin 2 (two) times daily. 7 units before breakfast 10/02/19 08/01/2021 Yes Triplett, Cari B, FNP  metFORMIN (GLUCOPHAGE) 500 MG tablet Take 500 mg by mouth 2 (two) times daily with a meal. 10/11/19  Yes [provider]  traZODone (DESYREL) 50 MG tablet Take 50 mg by mouth at bedtime. 11/20/20  Yes [provider]  BD INSULIN SYRINGE U/F 31G X 5/16" 0.3 ML MISC See admin instructions. 12/14/20   [provider]  glucose blood (PRECISION QID TEST) test strip 1 each (1 strip total) 3 (three) times daily To check blood sugars Dx: E11.9 11/24/20   [provider]  glyBURIDE (DIABETA) 5 MG tablet Take 5 mg by mouth every morning. 07/11/21   [provider]    Inpatient Medications: Scheduled Meds:  sodium chloride   Intravenous Once   sodium chloride   Intravenous Once   sodium chloride   Intravenous Once   atorvastatin  10 mg Per Tube QHS   Chlorhexidine Gluconate Cloth  6 each Topical Q0600   docusate  100 mg Per Tube BID   fentaNYL (SUBLIMAZE) injection  50 mcg Intravenous Once   gabapentin  300 mg Oral QHS   insulin aspart  0-15 Units Subcutaneous Q4H   insulin glargine-yfgn  10 Units Subcutaneous  BID   midazolam       polyethylene glycol  17 g Per Tube Daily   sodium chloride flush  3 mL Intravenous Q12H   traZODone  50 mg Oral QHS   Continuous Infusions:  sodium chloride Stopped (07/16/21 1541)   sodium chloride Stopped (07/16/21 1541)   cefTRIAXone (ROCEPHIN)  IV Stopped (07/16/21 0650)   fentaNYL infusion INTRAVENOUS 200 mcg/hr (07/16/21 1600)   norepinephrine (LEVOPHED) Adult infusion 10 mcg/min (07/16/21 1610)   phenylephrine (NEO-SYNEPHRINE) Adult infusion 160 mcg/min (07/16/21 1639)    sodium bicarbonate (isotonic) infusion in sterile water 100 mL/hr at 07/16/21 1600   vasopressin 0.03 Units/min (07/16/21 1426)   PRN Meds: sodium chloride, acetaminophen, fentaNYL (SUBLIMAZE) injection, fentaNYL (SUBLIMAZE) injection, hydrALAZINE, midazolam, midazolam, ondansetron (ZOFRAN) IV, ondansetron (ZOFRAN) IV, sodium chloride flush  Allergies:  No Known Allergies  Social History:   Social History   Socioeconomic History   Marital status: Widowed    Spouse name: Not on file   Number of children: Not on file   Years of education: Not on file   Highest education level: Not on file  Occupational History   Not on file  Tobacco Use   Smoking status: Former    Packs/day: 0.50    Types: Cigarettes   Smokeless tobacco: Never  Substance and Sexual Activity   Alcohol use: No   Drug use: Not on file   Sexual activity: Not on file  Other Topics Concern   Not on file  Social History Narrative   Not on file   Social Determinants of Health   Financial Resource Strain: Not on file  Food Insecurity: Not on file  Transportation Needs: Not on file  Physical Activity: Not on file  Stress: Not on file  Social Connections: Not on file  Intimate Partner Violence: Not on file     Family History:   Family History  Problem Relation Age of Onset   Diabetes Mother    Lung cancer Brother    Heart attack Brother    Diabetes Maternal Aunt    Diabetes Maternal Uncle     ROS:   Review of Systems  Unable to perform ROS: Critical illness     Physical Exam/Data:   Vitals:   07/16/21 1620 07/16/21 1625 07/16/21 1630 07/16/21 1645  BP: (!) 84/56 (!) 86/56 (!) 87/59 92/60  Pulse: (!) 108 (!) 109 (!) 109 (!) 110  Resp: (!) 21 (!) 22 (!) 23 (!) 27  Temp:      TempSrc:      SpO2: (!) 89% 90% 91% 92%  Weight:      Height:        Intake/Output Summary (Last 24 hours) at 07/16/2021 1719 Last data filed at 07/16/2021 1600 Gross per 24 hour  Intake 1926.99 ml  Output 960 ml  Net 966.99 ml   Filed Weights   07/29/2021 1305 07/07/2021 1910  Weight: 49.9 kg 47.6 kg   Body mass index is 18.01 kg/m.   Physical Exam: General: Chronically ill-appearing, intubated, sedated, requiring vasopressor support. Head: Normocephalic, atraumatic, sclera non-icteric, no xanthomas, nares without discharge.  Neck: Negative for carotid bruits. JVD not elevated. Lungs: Vented breath sounds. Heart: Tachycardic with S1 S2. No murmurs, rubs, or gallops appreciated. Abdomen: Soft, non-tender, non-distended with normoactive bowel sounds. No hepatomegaly. No rebound/guarding. No obvious abdominal masses. Msk:  Strength and tone appear normal for age. Extremities: No clubbing or cyanosis. No edema.  Right groin hematoma. Neuro: Intubated and sedated. Psych: Intubated and sedated   EKG:  The EKG was personally reviewed and demonstrates: Sinus tachycardia, 133 bpm, ST elevation in leads aVR and V1 Telemetry:  Telemetry was personally reviewed and demonstrates: Sinus tachycardia with brief atrial runs  Weights: [facility] Weights   07/17/2021 1305 07/23/2021 1910  Weight: 49.9 kg 47.6 kg    Relevant CV Studies: Pending  Laboratory Data:  Chemistry Recent Labs  Lab 07/16/21 0448 07/16/21 0716 07/16/21 1530  NA 134* 136 142  K 5.4* 5.1 4.5  CL 103 104 104  CO2 19* 18* 17*  GLUCOSE 318* 326* 251*  BUN 20 22 25*  CREATININE 0.96 0.80 1.11*  CALCIUM 7.6* 7.6* 7.8*  GFRNONAA >60 >60  53*  ANIONGAP 12 14 21*    Recent Labs  Lab 07/16/21 1530  PROT <3.0*  ALBUMIN 1.7*  AST 459*  ALT 409*  ALKPHOS 50  BILITOT 0.4   Hematology Recent Labs  Lab 07/20/2021 2033 07/16/21 0224 07/16/21 0448 07/16/21 0716 07/16/21 1300  WBC 12.1*  --  20.7*  --   --   RBC 2.95*  --  2.99*  --   --   HGB 9.1*   < >  9.3* 9.0* 8.6*  HCT 27.2*   < > 27.5* 26.5* 26.3*  MCV 92.2  --  92.0  --   --   MCH 30.8  --  31.1  --   --   MCHC 33.5  --  33.8  --   --   RDW 13.4  --  15.0  --   --   PLT 283   292  --  215  --   --    < > = values in this interval not displayed.   Cardiac EnzymesNo results for input(s): TROPONINI in the last 168 hours. No results for input(s): TROPIPOC in the last 168 hours.  BNPNo results for input(s): BNP, PROBNP in the last 168 hours.  DDimer  Recent Labs  Lab 07/17/2021 2033  DDIMER >20.00*    Radiology/Studies:  CT ABDOMEN PELVIS WO CONTRAST  Result Date: 07/23/2021 IMPRESSION: 1. Large hyperdense collection/hematoma primarily within the subcutaneous soft tissues superficial to the right lower quadrant abdominal wall and rectus, this extends to the right flank region above the level of umbilicus and inferiorly to the proximal thigh with the inferior extent incompletely visualized. There is a lobulated focal hyperdense collection/hematoma in the right groin anterior to the common femoral vessels consistent with groin hematoma. Correlation with sonography could be obtained to assess for pseudoaneurysm. 2. Critical Value/emergent results were called by telephone at the time of interpretation on 07/14/2021 at 10:21 pm to provider Rufina Falco , who verbally acknowledged these results. Electronically Signed   By: Donavan Foil M.D.   On: 07/11/2021 22:22   PERIPHERAL VASCULAR CATHETERIZATION  Result Date: 07/25/2021 See surgical note for result.  DG Chest Port 1 View  Result Date: 07/16/2021 IMPRESSION: 1. Slightly reduced lung volumes with vascular crowding  and hazy bilateral airspace opacities favored to reflect dependent atelectasis. 2. Support lines and tubes as above. Electronically Signed   By: Dahlia Bailiff M.D.   On: 07/16/2021 14:58   DG Chest Port 1 View  Result Date: 07/21/2021 IMPRESSION: Right IJ central venous catheter tip over the cavoatrial region. No pneumothorax Electronically Signed   By: Donavan Foil M.D.   On: 07/29/2021 21:35    Assessment and Plan:   1.  Cardiac arrest with hemorrhagic shock and right groin hematoma: -Status post ROSC -Presumed to be in the setting of hemorrhagic shock with acute blood loss anemia -Bedside echo with EF approximately 20 to 25% with baseline EF uncertain -Recommend obtaining formal echo -No evidence of malignant arrhythmia on telemetry -Patient too unstable to be taken emergently to the Cath Lab -Unable to place an Impella or balloon pump given ischemic left lower extremity and right lower hematoma with possible pseudoaneurysm noted on imaging -Maintain vasopressor support with Levophed and phenylephrine -Transfuse as indicated per primary service -Could use amiodarone if needed for tachyarrhythmia -Telemetry consistent with sinus tach with brief atrial runs -Vent management per CCM  2.  Acute HFrEF with NSTEMI: -Baseline EF uncertain -Obtain formal echo -Unable to escalate GDMT at this time secondary to shock requiring vasopressor support -Will need ischemic evaluation once improved from acute illness, pending clinical course -Unable to heparinize secondary to hemorrhagic shock -Hold antiplatelet therapy  3.  PAD with groin hematoma: -Management per vascular surgery      For questions or updates, please contact [place] Please consult [email or web address] for contact info under Cardiology/STEMI.   Signed, Christell Faith, PA-C [place] Pager: [phone] 07/16/2021, 5:19 PM

## 2021-07-16 NOTE — Progress Notes (Addendum)
Called bedside by nursing to evaluate patient.  Ipsilateral pupils on assessment: L-5/non reactive & R-3/sluggish Unable to perform additional neuro assessment at this time with fentanyl drip infusing on mechanical ventilatory support.  - STAT CTH wo contrast to rule out ICH/CVA, CTa to rule out PE post cardiac arrest, CT abdomen/pelvis to monitor hematoma  - NS bolus pre-contrast  CT head wo contrast 07/16/21: Negative for intracranial hemorrhage. Abnormal low attenuation involving the superior cerebellum in addition to abnormal low-density within the pons and patchy low density in the brainstem and left thalamus. Findings are suspicious for acute infarct. No hydrocephalus at this time. Correlation with CT angiography could be obtained to assess for basilar thrombosis though no hyperdense vessel seen on this non contrasted head CT.  CTa chest 07/16/21: negative for PE, multifocal pneumonia and small bilateral pleural effusions  CT abdomen/pelvis with contrast 07/16/21: demonstrates significant decrease in R anterior abdominal/pelvic wall & R groin hematoma compared to previous CT  - STAT Tele-neurology consult for recommendations regarding abnormal CTH. - continue Ceftriaxone for UTI & Pneumonia  Family updated throughout process with Spanish interpreter assistance. All questions and concerns answered at this time.  Addendum: Discussed case with Tele-neurology who recommends CTa head/neck & perfusion to rule out basilar thrombosis. - STAT CTa head/neck & perfusion ordered - LR bolus post contrast  Additional CC time 30 minutes  Betsey Holiday, AGACNP-BC Acute Care Nurse Practitioner [place] [facility]   [phone] / [phone] Please see Amion for pager details.

## 2021-07-16 NOTE — Procedures (Signed)
° °  PATIENT NAME: [place] RECORD NUMBER: [id number] Birthday: 04-May-1951  Age: 71 y.o. Admit Date: 07/17/2021  Provider: Lanney Gins  Indication: circulatory shock  Technical Description:  CPR performance duration: yes  Was defibrillation or cardioversion used ? no Was external pacer placed ? no Was patient intubated pre/post CPR ? Yes  Was transvenous pacer placed ? no  Medications Administered Include      Yes/no Amiodarone Yes   Atropin no  Calcium Yes   Epinephrine yes  Lidocaine yes  Magnesium none  Norepinephrine yes  Phenylephrine yes  Sodium bicarbonate no  Vasopression yes   Evaluation Final Status - Was patient successfully resuscitated ? Yes  If successfully resuscitated - what is current rhythm ? Sinus tach If successfully resuscitated - what is current hemodynamic status ? shock  Miscellaneous Information Post vascular surgery with large hematoma on right groin with tachyarrythmia and circulatory shock  Ottie Glazier 2/10/20232:34 PM      Ottie Glazier, M.D.  Pulmonary & [place]

## 2021-07-16 NOTE — Plan of Care (Signed)

## 2021-07-16 NOTE — Progress Notes (Signed)
Notified by RN pts family requesting pt transfer to [facility]. Spoke with pt transfer center at [facility], and at this time there are no MICU beds available.  Also, informed [facility] does not have a pt transfer waiting list.  Representative recommended calling transfer center daily to inquire about bed availability.  Utilized interpretor to inform family at this time there are no MICU beds available at [facility].  Also provided an update regarding pts condition and current plan of care. All questions were answered and pts family were appreciative to receive a detailed update.  Will continue to monitor and assess pt.   Zada Girt, AGNP  Pulmonary/Critical Care Pager [phone] (please enter 7 digits) PCCM Consult Pager [phone] (please enter 7 digits)

## 2021-07-16 NOTE — Progress Notes (Addendum)
Abnormal EKG noted as below with ST elevation in the anterior leads, no prior EKG for comparison. Patient currently denies chest pain or any other associated symptoms of nausea, vomiting, diaphoresis.    PLAN Abnormal EKG ?STEMI Sinus Tachycardia in the setting of Hemorrhagic Shock -Serial EKG -Obtain STAT Troponin -Discussed with on call STEMI doctor Herbie Baltimore who reviewed the EKG. Per Dr. Herbie Baltimore, does not meet criteria for STEMI, no further interventions recommended. Will continue to monitor      Webb Silversmith, DNP, CCRN, FNP-C, AGACNP-BC Acute Care Nurse Practitioner  [place] [facility] Pager: [phone] [place] at [facility]

## 2021-07-17 ENCOUNTER — Inpatient Hospital Stay: Payer: Medicare Other

## 2021-07-17 ENCOUNTER — Inpatient Hospital Stay (HOSPITAL_COMMUNITY)
Admission: RE | Admit: 2021-07-17 | Discharge: 2021-07-17 | Disposition: A | Discharge status: Home / Self Care | Payer: Medicare Other | Source: Home / Self Care | Attending: Physician Assistant | Admitting: Physician Assistant

## 2021-07-17 ENCOUNTER — Encounter: Payer: Self-pay | Admitting: Vascular Surgery

## 2021-07-17 DIAGNOSIS — I639 Cerebral infarction, unspecified: Secondary | ICD-10-CM

## 2021-07-17 DIAGNOSIS — I5021 Acute systolic (congestive) heart failure: Secondary | ICD-10-CM

## 2021-07-17 DIAGNOSIS — I70229 Atherosclerosis of native arteries of extremities with rest pain, unspecified extremity: Secondary | ICD-10-CM

## 2021-07-17 DIAGNOSIS — I469 Cardiac arrest, cause unspecified: Secondary | ICD-10-CM

## 2021-07-17 LAB — GLUCOSE, CAPILLARY
Glucose-Capillary: 107 mg/dL — ABNORMAL HIGH (ref 70–99)
Glucose-Capillary: 109 mg/dL — ABNORMAL HIGH (ref 70–99)
Glucose-Capillary: 125 mg/dL — ABNORMAL HIGH (ref 70–99)
Glucose-Capillary: 44 mg/dL — CL (ref 70–99)
Glucose-Capillary: 78 mg/dL (ref 70–99)
Glucose-Capillary: 89 mg/dL (ref 70–99)
Glucose-Capillary: 98 mg/dL (ref 70–99)

## 2021-07-17 LAB — CBC WITH DIFFERENTIAL/PLATELET
Abs Immature Granulocytes: 0.05 10*3/uL (ref 0.00–0.07)
Basophils Absolute: 0 10*3/uL (ref 0.0–0.1)
Basophils Relative: 0 %
Eosinophils Absolute: 0 10*3/uL (ref 0.0–0.5)
Eosinophils Relative: 0 %
HCT: 21.7 % — ABNORMAL LOW (ref 36.0–46.0)
Hemoglobin: 7.4 g/dL — ABNORMAL LOW (ref 12.0–15.0)
Immature Granulocytes: 0 %
Lymphocytes Relative: 8 %
Lymphs Abs: 1.2 10*3/uL (ref 0.7–4.0)
MCH: 31.6 pg (ref 26.0–34.0)
MCHC: 34.1 g/dL (ref 30.0–36.0)
MCV: 92.7 fL (ref 80.0–100.0)
Monocytes Absolute: 0.7 10*3/uL (ref 0.1–1.0)
Monocytes Relative: 5 %
Neutro Abs: 13.1 10*3/uL — ABNORMAL HIGH (ref 1.7–7.7)
Neutrophils Relative %: 87 %
Platelets: 121 10*3/uL — ABNORMAL LOW (ref 150–400)
RBC: 2.34 MIL/uL — ABNORMAL LOW (ref 3.87–5.11)
RDW: 15 % (ref 11.5–15.5)
Smear Review: NORMAL
WBC: 15.1 10*3/uL — ABNORMAL HIGH (ref 4.0–10.5)
nRBC: 0 % (ref 0.0–0.2)

## 2021-07-17 LAB — BASIC METABOLIC PANEL
Anion gap: 9 (ref 5–15)
BUN: 29 mg/dL — ABNORMAL HIGH (ref 8–23)
CO2: 26 mmol/L (ref 22–32)
Calcium: 7 mg/dL — ABNORMAL LOW (ref 8.9–10.3)
Chloride: 99 mmol/L (ref 98–111)
Creatinine, Ser: 1.21 mg/dL — ABNORMAL HIGH (ref 0.44–1.00)
GFR, Estimated: 48 mL/min — ABNORMAL LOW (ref >60)
Glucose, Bld: 156 mg/dL — ABNORMAL HIGH (ref 70–99)
Potassium: 4.1 mmol/L (ref 3.5–5.1)
Sodium: 134 mmol/L — ABNORMAL LOW (ref 135–145)

## 2021-07-17 LAB — LIPID PANEL
Cholesterol: 34 mg/dL (ref 0–200)
HDL: 16 mg/dL — ABNORMAL LOW (ref >40)
LDL Cholesterol: 8 mg/dL (ref 0–99)
Total CHOL/HDL Ratio: 2.1 RATIO
Triglycerides: 49 mg/dL (ref <150)
VLDL: 10 mg/dL (ref 0–40)

## 2021-07-17 LAB — ECHOCARDIOGRAM COMPLETE
AR max vel: 1.29 cm2
AV Peak grad: 8.3 mmHg
Ao pk vel: 1.44 m/s
Area-P 1/2: 6.43 cm2
Calc EF: 24.8 %
Height: 64 in
MV M vel: 4.72 m/s
MV Peak grad: 89.1 mmHg
Radius: 0.6 cm
S' Lateral: 4.34 cm
Single Plane A2C EF: 29.9 %
Single Plane A4C EF: 20.1 %
Weight: 1679.02 oz

## 2021-07-17 LAB — BLOOD GAS, ARTERIAL: Mechanical Rate: 20

## 2021-07-17 LAB — HEMOGLOBIN AND HEMATOCRIT, BLOOD
HCT: 26.3 % — ABNORMAL LOW (ref 36.0–46.0)
HCT: 26 % — ABNORMAL LOW (ref 36.0–46.0)
HCT: 27.8 % — ABNORMAL LOW (ref 36.0–46.0)
Hemoglobin: 9.1 g/dL — ABNORMAL LOW (ref 12.0–15.0)
Hemoglobin: 8.8 g/dL — ABNORMAL LOW (ref 12.0–15.0)
Hemoglobin: 9.3 g/dL — ABNORMAL LOW (ref 12.0–15.0)

## 2021-07-17 LAB — MAGNESIUM: Magnesium: 2 mg/dL (ref 1.7–2.4)

## 2021-07-17 LAB — SODIUM
Sodium: 135 mmol/L (ref 135–145)
Sodium: 142 mmol/L (ref 135–145)
Sodium: 147 mmol/L — ABNORMAL HIGH (ref 135–145)

## 2021-07-17 LAB — LACTIC ACID, PLASMA: Lactic Acid, Venous: 3.2 mmol/L (ref 0.5–1.9)

## 2021-07-17 LAB — PHOSPHORUS: Phosphorus: 5 mg/dL — ABNORMAL HIGH (ref 2.5–4.6)

## 2021-07-17 MED ORDER — DEXTROSE 50 % IV SOLN
INTRAVENOUS | Status: AC
  Filled 2021-07-17: qty 50

## 2021-07-17 MED ORDER — FUROSEMIDE 10 MG/ML IJ SOLN
INTRAMUSCULAR | Status: AC
  Filled 2021-07-17: qty 4

## 2021-07-17 MED ORDER — STERILE WATER FOR INJECTION IJ SOLN
INTRAMUSCULAR | Status: AC
  Administered 2021-07-17: 10 mL
  Filled 2021-07-17: qty 10

## 2021-07-17 MED ORDER — FENTANYL CITRATE (PF) 100 MCG/2ML IJ SOLN
100.0000 ug | Freq: Once | INTRAMUSCULAR | Status: AC
  Administered 2021-07-17: 100 ug via INTRAVENOUS

## 2021-07-17 MED ORDER — PERFLUTREN LIPID MICROSPHERE
1.0000 mL | INTRAVENOUS | Status: AC | PRN
  Administered 2021-07-17: 3.5 mL via INTRAVENOUS
  Filled 2021-07-17: qty 10

## 2021-07-17 MED ORDER — FUROSEMIDE 10 MG/ML IJ SOLN
40.0000 mg | Freq: Once | INTRAMUSCULAR | Status: AC
  Administered 2021-07-17: 40 mg via INTRAVENOUS

## 2021-07-17 MED ORDER — HEPARIN (PORCINE) 25000 UT/250ML-% IV SOLN
750.0000 [IU]/h | INTRAVENOUS | Status: DC
  Administered 2021-07-17: 19:00:00 600 [IU]/h via INTRAVENOUS
  Filled 2021-07-17: qty 250

## 2021-07-17 MED ORDER — SODIUM CHLORIDE 0.9% FLUSH
10.0000 mL | Freq: Two times a day (BID) | INTRAVENOUS | Status: DC
  Administered 2021-07-17: 30 mL
  Administered 2021-07-17: 10 mL
  Administered 2021-07-18: 30 mL

## 2021-07-17 MED ORDER — MORPHINE SULFATE (PF) 2 MG/ML IV SOLN
INTRAVENOUS | Status: AC
  Administered 2021-07-17: 1 mg via INTRAVENOUS
  Filled 2021-07-17: qty 1

## 2021-07-17 MED ORDER — DEXTROSE IN LACTATED RINGERS 5 % IV SOLN: INTRAVENOUS | Status: DC

## 2021-07-17 MED ORDER — DEXTROSE 50 % IV SOLN
25.0000 g | INTRAVENOUS | Status: AC
  Administered 2021-07-17: 25 g via INTRAVENOUS

## 2021-07-17 MED ORDER — MIDAZOLAM HCL 2 MG/2ML IJ SOLN
2.0000 mg | Freq: Once | INTRAMUSCULAR | Status: AC
  Administered 2021-07-17: 2 mg via INTRAVENOUS

## 2021-07-17 MED ORDER — SODIUM CHLORIDE 3 % IV SOLN
INTRAVENOUS | Status: DC
  Filled 2021-07-17 (×12): qty 500

## 2021-07-17 MED ORDER — FENTANYL CITRATE (PF) 100 MCG/2ML IJ SOLN
INTRAMUSCULAR | Status: AC
  Filled 2021-07-17: qty 2

## 2021-07-17 MED ORDER — SODIUM CHLORIDE 3 % IV BOLUS
250.0000 mL | Freq: Once | INTRAVENOUS | Status: AC
  Administered 2021-07-17: 250 mL via INTRAVENOUS
  Filled 2021-07-17: qty 500

## 2021-07-17 MED ORDER — VECURONIUM BROMIDE 10 MG IV SOLR: 10.0000 mg | Freq: Once | INTRAVENOUS | Status: AC

## 2021-07-17 MED ORDER — ACETAMINOPHEN 650 MG RE SUPP
650.0000 mg | RECTAL | Status: DC | PRN
  Administered 2021-07-17: 650 mg via RECTAL
  Filled 2021-07-17: qty 1

## 2021-07-17 MED ORDER — SODIUM CHLORIDE 0.9% FLUSH: 10.0000 mL | INTRAVENOUS | Status: DC | PRN

## 2021-07-17 MED ORDER — VECURONIUM BROMIDE 10 MG IV SOLR
INTRAVENOUS | Status: AC
  Administered 2021-07-17: 10 mg via INTRAVENOUS
  Filled 2021-07-17: qty 10

## 2021-07-17 NOTE — Progress Notes (Signed)
Discussed with cardiology regarding the echo findings of LV thrombus. Also d/w vascular surgeon Dr Renne Crigler about heparin drip. Does have high risk for bleeding but with a LV thrombus, basilar thrombus and potential for further embolization in setting of poor cardiac function, will start heparin drip stroke protocol. -- Milon Dikes, MD Neurologist [facility] Pager: [phone]

## 2021-07-17 NOTE — Progress Notes (Signed)
Acute Basilar tip thrombosis CTa head/neck & perfusion scan 07/17/21: Acute basilar tip thrombosis as detailed above. 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 3 mm left paraophthalmic aneurysm. Multifocal pneumonia with layering bilateral pleural effusions,partially visualized.  - discussed case with Tele-neurologist who alerted and spoke with Neurosurgery at [facility]. Patient is not a candidate for surgical intervention. - Unable to anticoagulate due to high risk for bleeding with improving abdominal wall and R groin hematoma - Consult neurology, appreciate input - continue frequent neuro checks - sedation off as tolerated to monitor neurological status - lipid panel ordered - consider MRI brain per neurology recommendations   Family updated at length with interpreter service assistance. Discussed overall grave prognosis and high risk for deterioration. All questions and concerns answered at this time.   Domingo Pulse [place], AGACNP-BC Acute Care Nurse Practitioner [place] [facility]   [phone] / [phone] Please see Amion for pager details.

## 2021-07-17 NOTE — Progress Notes (Signed)
Subjective: Interval History: Intubated and sedated, unable to be interviewed.  Objective: Vital signs in last 24 hours: Temp:  [94.9 F (34.9 C)-102 F (38.9 C)] 102 F (38.9 C) (02/11 1045) Pulse Rate:  [76-147] 108 (02/11 1045) Resp:  [5-38] 24 (02/11 1045) BP: (68-149)/(42-97) 113/56 (02/11 1045) SpO2:  [35 %-100 %] 90 % (02/11 1045) FiO2 (%):  [30 %-60 %] 40 % (02/11 0800)  Intake/Output from previous day: 02/10 0701 - 02/11 0700 In: 4547.3 [P.O.:100; I.V.:2799.2; Blood:342; IV Piggyback:1306.1] Out: 870 [Urine:870] Intake/Output this shift: Total I/O In: 711.2 [I.V.:312.7; Blood:390; IV Piggyback:8.5] Out: 75 [Urine:75]  Physical examination: Intubated and sedated.  By report improving bruising of the right abdomen and leg.  Lab Results: Recent Labs    07/16/21 0448 07/16/21 0716 07/17/21 0321 07/17/21 1004  WBC 20.7*  --  15.1*  --   HGB 9.3*   < > 7.4* 9.1*  HCT 27.5*   < > 21.7* 26.3*  PLT 215  --  121*  --    < > = values in this interval not displayed.   BMET Recent Labs    07/16/21 1530 07/17/21 0321 07/17/21 1004  NA 142 134* 135  K 4.5 4.1  --   CL 104 99  --   CO2 17* 26  --   GLUCOSE 251* 156*  --   BUN 25* 29*  --   CREATININE 1.11* 1.21*  --   CALCIUM 7.8* 7.0*  --     Studies/Results: CT ABDOMEN PELVIS WO CONTRAST  Result Date: 07/16/2021 CLINICAL DATA:  Possible retroperitoneal bleed EXAM: CT ABDOMEN AND PELVIS WITHOUT CONTRAST TECHNIQUE: Multidetector CT imaging of the abdomen and pelvis was performed following the standard protocol without IV contrast. RADIATION DOSE REDUCTION: This exam was performed according to the departmental dose-optimization program which includes automated exposure control, adjustment of the mA and/or kV according to patient size and/or use of iterative reconstruction technique. COMPARISON:  Chest x-ray 07/14/2021 FINDINGS: Lower chest: Lung bases demonstrate no acute consolidation or pleural effusion. Normal  cardiac size. Coronary vascular calcification. Hepatobiliary: Hyperdensity in the gallbladder likely reflects vicarious excretion of contrast. No biliary dilatation Pancreas: Unremarkable. No pancreatic ductal dilatation or surrounding inflammatory changes. Spleen: Normal in size without focal abnormality. Adrenals/Urinary Tract: Adrenal glands are normal. Kidneys show no hydronephrosis. Excreted contrast in the renal collecting systems and bladder. Bladder decompressed by Foley catheter Stomach/Bowel: Moderate fluid distension of the stomach. No dilated small bowel. No acute bowel wall thickening. Vascular/Lymphatic: Advanced aortic atherosclerosis. No aneurysm. Stents within the bilateral superficial femoral arteries incompletely visualized. Lobulated hyperdense collection in the right groin measuring 5.1 by 2.7 by 8.5 cm consistent with hematoma. Extensive stranding and hyperdense fluid within the subcutaneous soft tissues anterior to the right hip, this extends over the right pubic area and into the right thigh but the inferior extent is incompletely visualized. Large hyperdense fluid collection/hematoma within the subcutaneous soft tissues, mostly superficial to the right rectus and lower quadrant abdominal wall. This extends to the right flank region. This measures approximately 14.2 cm transverse by 5.5 cm AP by 20.1 cm oblique craniocaudad. The collection is difficult to separate from the underlying right rectus and a component of rectus sheath hematoma is possible. Reproductive: Uterus unremarkable.  No adnexal mass. Other: Negative for pelvic effusion or free air. Musculoskeletal: No acute osseous abnormality. IMPRESSION: 1. Large hyperdense collection/hematoma primarily within the subcutaneous soft tissues superficial to the right lower quadrant abdominal wall and rectus, this extends to the  right flank region above the level of umbilicus and inferiorly to the proximal thigh with the inferior extent  incompletely visualized. There is a lobulated focal hyperdense collection/hematoma in the right groin anterior to the common femoral vessels consistent with groin hematoma. Correlation with sonography could be obtained to assess for pseudoaneurysm. 2. Critical Value/emergent results were called by telephone at the time of interpretation on 07/23/2021 at 10:21 pm to provider Webb SilversmithELIZABETH OUMA , who verbally acknowledged these results. Electronically Signed   By: Jasmine PangKim  Fujinaga M.D.   On: July 21, 2021 22:22   CT HEAD WO CONTRAST (5MM)  Result Date: 07/16/2021 CLINICAL DATA:  Altered mental status EXAM: CT HEAD WITHOUT CONTRAST TECHNIQUE: Contiguous axial images were obtained from the base of the skull through the vertex without intravenous contrast. RADIATION DOSE REDUCTION: This exam was performed according to the departmental dose-optimization program which includes automated exposure control, adjustment of the mA and/or kV according to patient size and/or use of iterative reconstruction technique. COMPARISON:  None. FINDINGS: Brain: No hemorrhage or intracranial mass is visualized. Focal hypodensity within the left thalamus. Patchy hypodensity within the pons and brainstem with diffuse hypodensity within the superior cerebellar hemispheres. Fourth ventricle patent. Some effacement of quadrigeminal plate cistern. Lateral and third ventricles are nondilated. Vascular: No hyperdense vessels.  Carotid vascular calcification Skull: Normal. Negative for fracture or focal lesion. Sinuses/Orbits: Fluid levels in the maxillary sinuses. Moderate mucosal thickening in the ethmoid sinuses Other: None IMPRESSION: 1. Negative for intracranial hemorrhage. 2. Abnormal low attenuation involving the superior cerebellum in addition to abnormal low-density within the pons and patchy low density in the brainstem and left thalamus. Findings are suspicious for acute infarct. No hydrocephalus at this time. Correlation with CT angiography  could be obtained to assess for basilar thrombosis though no hyperdense vessel seen on this non contrasted head CT. Electronically Signed   By: Jasmine PangKim  Fujinaga M.D.   On: 07/16/2021 22:15   CT Angio Chest Pulmonary Embolism (PE) W or WO Contrast  Result Date: 07/16/2021 CLINICAL DATA:  Shortness of breath, altered mental status, evaluate for PE EXAM: CT ANGIOGRAPHY CHEST WITH CONTRAST TECHNIQUE: Multidetector CT imaging of the chest was performed using the standard protocol during bolus administration of intravenous contrast. Multiplanar CT image reconstructions and MIPs were obtained to evaluate the vascular anatomy. RADIATION DOSE REDUCTION: This exam was performed according to the departmental dose-optimization program which includes automated exposure control, adjustment of the mA and/or kV according to patient size and/or use of iterative reconstruction technique. CONTRAST:  100mL OMNIPAQUE IOHEXOL 350 MG/ML SOLN COMPARISON:  Chest radiograph dated 07/16/2021 FINDINGS: Cardiovascular: Satisfactory opacification of the bilateral pulmonary arteries to the lobar level. No evidence of pulmonary embolism. No evidence of thoracic aortic aneurysm. Mild atherosclerotic calcifications. The heart is normal in size.  No pericardial effusion. Three vessel coronary atherosclerosis. Right IJ venous catheter terminates at the cavoatrial junction. Mediastinum/Nodes: Small mediastinal lymph nodes which do not meet pathologic CT size criteria. Visualized thyroid is unremarkable. Lungs/Pleura: Multifocal patchy opacities in the posterior upper and lower lobes, left lower lobe predominant, suspicious for multifocal pneumonia, possibly on the basis of aspiration. Small bilateral pleural effusions with associated mild compressive atelectasis in the bilateral lung bases. Mild biapical pleural-parenchymal scarring. No suspicious pulmonary nodules. No pneumothorax. Upper Abdomen: Evaluated on dedicated CT. Musculoskeletal: No focal  osseous lesions. Review of the MIP images confirms the above findings. IMPRESSION: No evidence of pulmonary embolism. Multifocal pneumonia, left lower lobe predominant, possibly on the basis of aspiration. Small bilateral  pleural effusions. Aortic Atherosclerosis (ICD10-I70.0). Electronically Signed   By: Charline Bills M.D.   On: 07/16/2021 22:12   CT ABDOMEN PELVIS W CONTRAST  Result Date: 07/16/2021 CLINICAL DATA:  Follow-up retroperitoneal hematoma. EXAM: CT ABDOMEN AND PELVIS WITH CONTRAST TECHNIQUE: Multidetector CT imaging of the abdomen and pelvis was performed using the standard protocol following bolus administration of intravenous contrast. RADIATION DOSE REDUCTION: This exam was performed according to the departmental dose-optimization program which includes automated exposure control, adjustment of the mA and/or kV according to patient size and/or use of iterative reconstruction technique. CONTRAST:  OMNIPAQUE IOHEXOL 350 MG/ML SOLN COMPARISON:  CT abdomen pelvis dated 07/12/2021. FINDINGS: Lower chest: Partially visualized small bilateral pleural effusions with partial compressive atelectasis of the lower lobes versus pneumonia. No intra-abdominal free air.  Small free fluid in the pelvis. Hepatobiliary: The liver is unremarkable. No intrahepatic biliary dilatation. High attenuating content within the gallbladder may represent vicarious excretion of the contrast or sludge versus small stones. Pancreas: Unremarkable. No pancreatic ductal dilatation or surrounding inflammatory changes. Spleen: The spleen is somewhat small. Adrenals/Urinary Tract: The adrenal glands unremarkable. The kidneys, and the visualized ureters are unremarkable. The urinary bladder is decompressed around a Foley catheter. Stomach/Bowel: Enteric tube is noted with tip in the body of the stomach. There is no bowel obstruction or active inflammation. The appendix is normal. Vascular/Lymphatic: Advanced aortoiliac  atherosclerotic disease. Vascular stents noted in the proximal superficial femoral arteries which are not well evaluated but appear occluded. The IVC is unremarkable. No portal venous gas. There is no adenopathy. Reproductive: The uterus is grossly unremarkable. Other: A compression device noted over the right groin. Significant decrease in the right anterior abdominal/pelvic wall intramuscular hematoma compared to prior CT as well as decrease in the size of the right groin hematoma. Musculoskeletal: Osteopenia with degenerative changes of the spine. No acute osseous pathology. IMPRESSION: 1. Significant decrease in the right anterior abdominal/pelvic wall intramuscular hematoma compared to prior CT as well as decrease in the size of the right groin hematoma. 2. No bowel obstruction. Normal appendix. 3. Partially visualized small bilateral pleural effusions with partial compressive atelectasis of the lower lobes versus pneumonia. 4. Aortic Atherosclerosis (ICD10-I70.0). Electronically Signed   By: Elgie Collard M.D.   On: 07/16/2021 22:10   PERIPHERAL VASCULAR CATHETERIZATION  Result Date: 07/09/2021 See surgical note for result.  DG Chest Port 1 View  Result Date: 07/16/2021 CLINICAL DATA:  Chest pain EXAM: PORTABLE CHEST 1 VIEW COMPARISON:  May 14, 2022 FINDINGS: Numerous radiopaque external lines overlie the chest. Defibrillator leads overlie the left hemithorax. Right IJ CVC with tip overlying the superior cavoatrial junction. Endotracheal tube with tip in the lower thoracic trachea. Enteric tube courses below the diaphragm with side port overlying the stomach and tip obscured by collimation. The heart size and mediastinal contours are within normal limits. Slightly reduced lung volumes with vascular crowding and hazy bilateral airspace opacities favored to reflect dependent atelectasis. No visible pleural effusion or pneumothorax. The visualized skeletal structures are unremarkable. IMPRESSION: 1.  Slightly reduced lung volumes with vascular crowding and hazy bilateral airspace opacities favored to reflect dependent atelectasis. 2. Support lines and tubes as above. Electronically Signed   By: Maudry Mayhew M.D.   On: 07/16/2021 14:58   DG Chest Port 1 View  Result Date: 07/10/2021 CLINICAL DATA:  Central line placement EXAM: PORTABLE CHEST 1 VIEW COMPARISON:  None. FINDINGS: Right IJ central venous catheter tip over the cavoatrial region. No visible pneumothorax. No  consolidation or effusion. Normal cardiac size. IMPRESSION: Right IJ central venous catheter tip over the cavoatrial region. No pneumothorax Electronically Signed   By: Jasmine Pang M.D.   On: 07/10/2021 21:35   CT ANGIO HEAD NECK W WO CM W PERF (CODE STROKE)  Result Date: 07/17/2021 CLINICAL DATA:  Initial evaluation for acute stroke. EXAM: CT ANGIOGRAPHY HEAD AND NECK TECHNIQUE: Multidetector CT imaging of the head and neck was performed using the standard protocol during bolus administration of intravenous contrast. Multiplanar CT image reconstructions and MIPs were obtained to evaluate the vascular anatomy. Carotid stenosis measurements (when applicable) are obtained utilizing NASCET criteria, using the distal internal carotid diameter as the denominator. RADIATION DOSE REDUCTION: This exam was performed according to the departmental dose-optimization program which includes automated exposure control, adjustment of the mA and/or kV according to patient size and/or use of iterative reconstruction technique. CONTRAST:  55mL OMNIPAQUE IOHEXOL 350 MG/ML SOLN COMPARISON:  Comparison made with prior head CT from 07/16/2021. FINDINGS: CTA NECK FINDINGS Aortic arch: Visualized aortic arch normal in caliber with normal 3 vessel morphology. Moderate atheromatous change about the origin of the great vessels without high-grade stenosis. Right carotid system: Right CCA patent from its origin to the bifurcation without significant stenosis. Mixed  plaque about the right carotid bulb/proximal right ICA with associated stenosis of up to 50% by NASCET criteria. Right ICA patent distally without stenosis or dissection. Left carotid system: Left CCA patent to the bifurcation without significant stenosis. Eccentric calcified plaque about the left carotid bulb/proximal left ICA without hemodynamically significant stenosis. Left ICA patent distally without stenosis or dissection. Vertebral arteries: Both vertebral arteries arise from the subclavian arteries. No significant proximal subclavian artery stenosis. Right vertebral artery strongly dominant with a diffusely hypoplastic left vertebral artery. Atheromatous change at the origin of the dominant right vertebral artery with moderate ostial stenosis. Vertebral arteries otherwise patent distally without stenosis or dissection. Skeleton: No discrete or worrisome osseous lesions. Mild for age cervical spondylosis. Other neck: Endotracheal and enteric tubes in place. The enteric tube is partially coiled within the oral cavity before coursing inferiorly. 1 right-sided central venous catheter in place. No other acute soft tissue abnormality within the neck. Upper chest: Multifocal pneumonia noted within the partially visualized lungs. Layering bilateral pleural effusions partially visualized as well. Review of the MIP images confirms the above findings CTA HEAD FINDINGS Anterior circulation: Petrous segments patent bilaterally. Atheromatous change within the carotid siphons with associated mild multifocal narrowing. 3 mm left paraophthalmic aneurysm noted (series 7, image 263). A1 segments patent bilaterally. Normal anterior communicating artery complex. Anterior cerebral arteries patent to their distal aspects without stenosis. No M1 stenosis or occlusion. Normal MCA bifurcations. Distal MCA branches perfused and symmetric. Posterior circulation: Both V4 segments patent without stenosis. Right PICA patent. Left PICA not  well seen. Basilar patent proximally. There is partially occlusive thrombus at the basilar tip (series 8, image 135). Superior cerebellar arteries remain patent distally. Both PCAs appear partially occluded at their origins, but remain patent and well perfused distally as well. Venous sinuses: Patent allowing for timing the contrast bolus. Anatomic variants: None significant. 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 Review of the MIP images confirms the above findings IMPRESSION: 1. 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 2. 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 3. 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 4. 3 mm left paraophthalmic aneurysm. 5.  Multifocal pneumonia with layering bilateral pleural effusions, partially visualized. Critical Value/emergent results were called by telephone at the time of interpretation on 07/17/2021 at 12:46 am to provider Dr. Karna ChristmasAleskerov, who verbally acknowledged these results. Electronically Signed   By: Rise MuBenjamin  McClintock M.D.   On: 07/17/2021 00:55   Anti-infectives: Anti-infectives (From admission, onward)    Start     Dose/Rate Route Frequency Ordered Stop   07/16/21 0645  cefTRIAXone (ROCEPHIN) 1 g in sodium chloride 0.9 % 100 mL IVPB        1 g 200 mL/hr over 30 Minutes Intravenous Every 24 hours 07/16/21 0550     07/26/2021 0115  ceFAZolin (ANCEF) IVPB 2g/100 mL premix        2 g 200 mL/hr over 30 Minutes Intravenous  Once 08/01/2021 0105 07/16/21 [id number]       Assessment/Plan: s/p Procedure(s): Lower Extremity Angiography (Left): Status post intervention for limb salvage with large right groin hematoma and perivascular collection, but suffered PEA with CPR and ROSC with intubation.  Cardiology and nephrology closely following.  Evidence of multifocal stroke, climbing troponins.  Poor cardiac function.  Unable to do  cardiac catheterization in the current setting.  Unable to anticoagulate due to recent events, neuro evaluation early this a.m. stipulated no intervention due to concern for conversion to hemorrhagic issues or herniation.  Discussed with family present, very guarded outcome.  To have MRI done as follow-up today.  Echo results pending.  Clinical course will be driven by cardiology and neurology findings.  This was emphasized to family present as well.  Appreciate all input.    LOS: 2 days   Learta Coddingarkten Chimere Klingensmith 07/17/2021, 11:19 AM

## 2021-07-17 NOTE — Progress Notes (Signed)
Acute Hypoxic Respiratory Failure  Patient became hypoxic into the 80's on mechanical ventilatory support with FiO2 100%. On bedside assessment patient asynchronous and unable to get complete volumes. Versed & fentanyl IV were administered without effect on respiratory status. Discussed with grand-daughter and daughter bedside, order and administered vecuronium IV. Patient remained hypoxic, even though volumes improved.  Ventilator settings adjusted with marginal improvement of SpO2 to 90%. STAT CXR revealed pulmonary edema, discussed the option of lasix IV with Dr. Everardo All at [facility], who was in agreement. Lasix ordered after discussion with family, who were also in agreement.  During these interventions the patient's daughter who was bedside stated that if her heart stopped they would not want to intervene.  The rest of the patient's children, brothers/sisters were called to discuss CODE STATUS change at that ponit as the patient is at a high risk of cardiac arrest.  Goals of Care The patient's brothers/sisters and children were called bedside due to a concern of impending cardiac arrest. The Clinical status was relayed to family in detail. The only family not present were a couple of children who are in [place], and are difficult to reach effectively by phone.   Patient with significant basilar stroke, acute combined heart failure, respiratory failure with pneumonia she has a very low chance of meaningful recovery despite all aggressive and optimal medical therapy. Patient is in the Dying  Process associated with Suffering.  Family understands the situation.  They have consented and agreed to DNR.  Family are satisfied with Plan of action and management. All questions answered with assistance of spanish interpreter services.  Additional CC time 32 mins   Cheryll Cockayne Rust-Chester, AGACNP-BC [facility]    Please see Amion for pager details.

## 2021-07-17 NOTE — Progress Notes (Signed)
Progress Note  Patient Name: Carrie Hernandez Date of Encounter: 07/17/2021  [facility] Cardiologist: None   Subjective   Unable to assess.  Intubated and sedated.  Inpatient Medications    Scheduled Meds:  sodium chloride   Intravenous Once   sodium chloride   Intravenous Once   sodium chloride   Intravenous Once   atorvastatin  10 mg Per Tube QHS   Chlorhexidine Gluconate Cloth  6 each Topical Q0600   docusate  100 mg Per Tube BID   gabapentin  300 mg Oral QHS   insulin aspart  0-15 Units Subcutaneous Q4H   insulin glargine-yfgn  10 Units Subcutaneous BID   polyethylene glycol  17 g Per Tube Daily   sodium chloride flush  10-40 mL Intracatheter Q12H   sodium chloride flush  3 mL Intravenous Q12H   traZODone  50 mg Oral QHS   Continuous Infusions:  sodium chloride Stopped (07/16/21 1541)   sodium chloride Stopped (07/16/21 1541)   albumin human 60 mL/hr at 07/17/21 0700   cefTRIAXone (ROCEPHIN)  IV Stopped (07/17/21 8295)   fentaNYL infusion INTRAVENOUS Stopped (07/16/21 2241)   norepinephrine (LEVOPHED) Adult infusion 8 mcg/min (07/17/21 0336)   phenylephrine (NEO-SYNEPHRINE) Adult infusion 30 mcg/min (07/17/21 0700)    sodium bicarbonate (isotonic) infusion in sterile water 100 mL/hr at 07/17/21 0700   vasopressin 0.01 Units/min (07/17/21 0700)   PRN Meds: sodium chloride, acetaminophen, fentaNYL, hydrALAZINE, midazolam, midazolam, ondansetron (ZOFRAN) IV, ondansetron (ZOFRAN) IV, sodium chloride flush, sodium chloride flush   Vital Signs    Vitals:   07/17/21 0600 07/17/21 0615 07/17/21 0630 07/17/21 0700  BP: 109/63 (!) 112/58 110/67 (!) 109/58  Pulse: 98 (!) 102 100 (!) 104  Resp: (!) 24 (!) 26 (!) 22 (!) 21  Temp: 99.9 F (37.7 C) 100 F (37.8 C) 100.2 F (37.9 C) (!) 100.6 F (38.1 C)  TempSrc:      SpO2: 93% 95% 91% 91%  Weight:      Height:        Intake/Output Summary (Last 24 hours) at 07/17/2021 0747 Last data filed at 07/17/2021  0700 Gross per 24 hour  Intake 4174.94 ml  Output 870 ml  Net 3304.94 ml   Last 3 Weights 07/12/2021 07/17/2021 07/13/2021  Weight (lbs) 104 lb 15 oz 110 lb 119 lb 0.8 oz  Weight (kg) 47.6 kg 49.896 kg 54 kg      Telemetry    Sinus rhythm/sinus tachycardia.  Occasional PVCs- Personally Reviewed  ECG    07/16/2021: Sinus tachycardia.  Rate 112 bpm.  LAFB.  Prior anteroseptal infarct.  Slight ST elevation in leads aVR, V1, V2 - Personally Reviewed  Physical Exam   VS:  BP (!) 107/58    Pulse (!) 107    Temp (!) 100.9 F (38.3 C)    Resp (!) 22    Ht 5\' 4"  (1.626 m)    Wt 47.6 kg    SpO2 91%    BMI 18.01 kg/m  , BMI Body mass index is 18.01 kg/m. GENERAL: Critically ill-appearing HEENT: Pupils equal round and reactive, fundi not visualized, oral mucosa unremarkable NECK:  No jugular venous distention, waveform within normal limits, carotid upstroke brisk and symmetric, no bruits, no thyromegaly LUNGS:  Clear to auscultation bilaterally HEART: Tachycardic.  Regular PMI not displaced or sustained,S1 and S2 within normal limits, no S3, no S4, no clicks, no rubs, no murmurs ABD:  Flat, positive bowel sounds normal in frequency in pitch, no  bruits, no rebound, no guarding, no midline pulsatile mass, no hepatomegaly, no splenomegaly EXT: Unable to palpate pedal pulses.  No edema, no cyanosis no clubbing SKIN:  No rashes no nodules NEURO: Anisocoria.  Movement of the left upper and lower extremity.  No movement of the right upper upper and lower extremity.  Unable to fully assess due to sedation. PSYCH: Unable to assess.  Intubated and sedated.   Labs    High Sensitivity Troponin:   Recent Labs  Lab 07/16/21 1001 07/16/21 1306 07/16/21 1530 07/16/21 1856 07/16/21 2259  TROPONINIHS 208* 598* 3,635* 13,952* >24,000*     Chemistry Recent Labs  Lab 07/16/21 0448 07/16/21 0716 07/16/21 1530 07/16/21 1856 07/17/21 0321  NA 134* 136 142  --  134*  K 5.4* 5.1 4.5  --  4.1  CL 103  104 104  --  99  CO2 19* 18* 17*  --  26  GLUCOSE 318* 326* 251*  --  156*  BUN 20 22 25*  --  29*  CREATININE 0.96 0.80 1.11*  --  1.21*  CALCIUM 7.6* 7.6* 7.8*  --  7.0*  MG 1.8  --   --  2.3 2.0  PROT  --   --  <3.0*  --   --   ALBUMIN  --   --  1.7*  --   --   AST  --   --  459*  --   --   ALT  --   --  409*  --   --   ALKPHOS  --   --  50  --   --   BILITOT  --   --  0.4  --   --   GFRNONAA >60 >60 53*  --  48*  ANIONGAP 12 14 21*  --  9    Lipids  Recent Labs  Lab 07/17/21 0321  CHOL 34  TRIG 49  HDL 16*  LDLCALC 8  CHOLHDL 2.1    Hematology Recent Labs  Lab 07/20/2021 2033 07/16/21 0224 07/16/21 0448 07/16/21 0716 07/16/21 1300 07/16/21 1858 07/17/21 0321  WBC 12.1*  --  20.7*  --   --   --  15.1*  RBC 2.95*  --  2.99*  --   --   --  2.34*  HGB 9.1*   < > 9.3*   < > 8.6* 8.2* 7.4*  HCT 27.2*   < > 27.5*   < > 26.3* 25.0* 21.7*  MCV 92.2  --  92.0  --   --   --  92.7  MCH 30.8  --  31.1  --   --   --  31.6  MCHC 33.5  --  33.8  --   --   --  34.1  RDW 13.4  --  15.0  --   --   --  15.0  PLT 283   292  --  215  --   --   --  121*   < > = values in this interval not displayed.   Thyroid  Recent Labs  Lab 07/16/21 1856  TSH 0.669  FREET4 1.24*    BNPNo results for input(s): BNP, PROBNP in the last 168 hours.  DDimer  Recent Labs  Lab 07/21/2021 2033  DDIMER >20.00*     Radiology    CT ABDOMEN PELVIS WO CONTRAST  Result Date: 08/02/2021 CLINICAL DATA:  Possible retroperitoneal bleed EXAM: CT ABDOMEN AND PELVIS WITHOUT CONTRAST TECHNIQUE: Multidetector CT imaging  of the abdomen and pelvis was performed following the standard protocol without IV contrast. RADIATION DOSE REDUCTION: This exam was performed according to the departmental dose-optimization program which includes automated exposure control, adjustment of the mA and/or kV according to patient size and/or use of iterative reconstruction technique. COMPARISON:  Chest x-ray 26-Feb-2022 FINDINGS:  Lower chest: Lung bases demonstrate no acute consolidation or pleural effusion. Normal cardiac size. Coronary vascular calcification. Hepatobiliary: Hyperdensity in the gallbladder likely reflects vicarious excretion of contrast. No biliary dilatation Pancreas: Unremarkable. No pancreatic ductal dilatation or surrounding inflammatory changes. Spleen: Normal in size without focal abnormality. Adrenals/Urinary Tract: Adrenal glands are normal. Kidneys show no hydronephrosis. Excreted contrast in the renal collecting systems and bladder. Bladder decompressed by Foley catheter Stomach/Bowel: Moderate fluid distension of the stomach. No dilated small bowel. No acute bowel wall thickening. Vascular/Lymphatic: Advanced aortic atherosclerosis. No aneurysm. Stents within the bilateral superficial femoral arteries incompletely visualized. Lobulated hyperdense collection in the right groin measuring 5.1 by 2.7 by 8.5 cm consistent with hematoma. Extensive stranding and hyperdense fluid within the subcutaneous soft tissues anterior to the right hip, this extends over the right pubic area and into the right thigh but the inferior extent is incompletely visualized. Large hyperdense fluid collection/hematoma within the subcutaneous soft tissues, mostly superficial to the right rectus and lower quadrant abdominal wall. This extends to the right flank region. This measures approximately 14.2 cm transverse by 5.5 cm AP by 20.1 cm oblique craniocaudad. The collection is difficult to separate from the underlying right rectus and a component of rectus sheath hematoma is possible. Reproductive: Uterus unremarkable.  No adnexal mass. Other: Negative for pelvic effusion or free air. Musculoskeletal: No acute osseous abnormality. IMPRESSION: 1. Large hyperdense collection/hematoma primarily within the subcutaneous soft tissues superficial to the right lower quadrant abdominal wall and rectus, this extends to the right flank region above the  level of umbilicus and inferiorly to the proximal thigh with the inferior extent incompletely visualized. There is a lobulated focal hyperdense collection/hematoma in the right groin anterior to the common femoral vessels consistent with groin hematoma. Correlation with sonography could be obtained to assess for pseudoaneurysm. 2. Critical Value/emergent results were called by telephone at the time of interpretation on 07/28/2021 at 10:21 pm to provider Webb SilversmithELIZABETH OUMA , who verbally acknowledged these results. Electronically Signed   By: Jasmine PangKim  Fujinaga M.D.   On: 26-Feb-2022 22:22   CT HEAD WO CONTRAST (5MM)  Result Date: 07/16/2021 CLINICAL DATA:  Altered mental status EXAM: CT HEAD WITHOUT CONTRAST TECHNIQUE: Contiguous axial images were obtained from the base of the skull through the vertex without intravenous contrast. RADIATION DOSE REDUCTION: This exam was performed according to the departmental dose-optimization program which includes automated exposure control, adjustment of the mA and/or kV according to patient size and/or use of iterative reconstruction technique. COMPARISON:  None. FINDINGS: Brain: No hemorrhage or intracranial mass is visualized. Focal hypodensity within the left thalamus. Patchy hypodensity within the pons and brainstem with diffuse hypodensity within the superior cerebellar hemispheres. Fourth ventricle patent. Some effacement of quadrigeminal plate cistern. Lateral and third ventricles are nondilated. Vascular: No hyperdense vessels.  Carotid vascular calcification Skull: Normal. Negative for fracture or focal lesion. Sinuses/Orbits: Fluid levels in the maxillary sinuses. Moderate mucosal thickening in the ethmoid sinuses Other: None IMPRESSION: 1. Negative for intracranial hemorrhage. 2. Abnormal low attenuation involving the superior cerebellum in addition to abnormal low-density within the pons and patchy low density in the brainstem and left thalamus. Findings are suspicious for  acute infarct. No hydrocephalus at this time. Correlation with CT angiography could be obtained to assess for basilar thrombosis though no hyperdense vessel seen on this non contrasted head CT. Electronically Signed   By: Jasmine Pang M.D.   On: 07/16/2021 22:15   CT Angio Chest Pulmonary Embolism (PE) W or WO Contrast  Result Date: 07/16/2021 CLINICAL DATA:  Shortness of breath, altered mental status, evaluate for PE EXAM: CT ANGIOGRAPHY CHEST WITH CONTRAST TECHNIQUE: Multidetector CT imaging of the chest was performed using the standard protocol during bolus administration of intravenous contrast. Multiplanar CT image reconstructions and MIPs were obtained to evaluate the vascular anatomy. RADIATION DOSE REDUCTION: This exam was performed according to the departmental dose-optimization program which includes automated exposure control, adjustment of the mA and/or kV according to patient size and/or use of iterative reconstruction technique. CONTRAST:  OMNIPAQUE IOHEXOL 350 MG/ML SOLN COMPARISON:  Chest radiograph dated 07/16/2021 FINDINGS: Cardiovascular: Satisfactory opacification of the bilateral pulmonary arteries to the lobar level. No evidence of pulmonary embolism. No evidence of thoracic aortic aneurysm. Mild atherosclerotic calcifications. The heart is normal in size.  No pericardial effusion. Three vessel coronary atherosclerosis. Right IJ venous catheter terminates at the cavoatrial junction. Mediastinum/Nodes: Small mediastinal lymph nodes which do not meet pathologic CT size criteria. Visualized thyroid is unremarkable. Lungs/Pleura: Multifocal patchy opacities in the posterior upper and lower lobes, left lower lobe predominant, suspicious for multifocal pneumonia, possibly on the basis of aspiration. Small bilateral pleural effusions with associated mild compressive atelectasis in the bilateral lung bases. Mild biapical pleural-parenchymal scarring. No suspicious pulmonary nodules. No  pneumothorax. Upper Abdomen: Evaluated on dedicated CT. Musculoskeletal: No focal osseous lesions. Review of the MIP images confirms the above findings. IMPRESSION: No evidence of pulmonary embolism. Multifocal pneumonia, left lower lobe predominant, possibly on the basis of aspiration. Small bilateral pleural effusions. Aortic Atherosclerosis (ICD10-I70.0). Electronically Signed   By: Charline Bills M.D.   On: 07/16/2021 22:12   CT ABDOMEN PELVIS W CONTRAST  Result Date: 07/16/2021 CLINICAL DATA:  Follow-up retroperitoneal hematoma. EXAM: CT ABDOMEN AND PELVIS WITH CONTRAST TECHNIQUE: Multidetector CT imaging of the abdomen and pelvis was performed using the standard protocol following bolus administration of intravenous contrast. RADIATION DOSE REDUCTION: This exam was performed according to the departmental dose-optimization program which includes automated exposure control, adjustment of the mA and/or kV according to patient size and/or use of iterative reconstruction technique. CONTRAST:  OMNIPAQUE IOHEXOL 350 MG/ML SOLN COMPARISON:  CT abdomen pelvis dated 07/12/2021. FINDINGS: Lower chest: Partially visualized small bilateral pleural effusions with partial compressive atelectasis of the lower lobes versus pneumonia. No intra-abdominal free air.  Small free fluid in the pelvis. Hepatobiliary: The liver is unremarkable. No intrahepatic biliary dilatation. High attenuating content within the gallbladder may represent vicarious excretion of the contrast or sludge versus small stones. Pancreas: Unremarkable. No pancreatic ductal dilatation or surrounding inflammatory changes. Spleen: The spleen is somewhat small. Adrenals/Urinary Tract: The adrenal glands unremarkable. The kidneys, and the visualized ureters are unremarkable. The urinary bladder is decompressed around a Foley catheter. Stomach/Bowel: Enteric tube is noted with tip in the body of the stomach. There is no bowel obstruction or active  inflammation. The appendix is normal. Vascular/Lymphatic: Advanced aortoiliac atherosclerotic disease. Vascular stents noted in the proximal superficial femoral arteries which are not well evaluated but appear occluded. The IVC is unremarkable. No portal venous gas. There is no adenopathy. Reproductive: The uterus is grossly unremarkable. Other: A compression device noted over the right groin. Significant  decrease in the right anterior abdominal/pelvic wall intramuscular hematoma compared to prior CT as well as decrease in the size of the right groin hematoma. Musculoskeletal: Osteopenia with degenerative changes of the spine. No acute osseous pathology. IMPRESSION: 1. Significant decrease in the right anterior abdominal/pelvic wall intramuscular hematoma compared to prior CT as well as decrease in the size of the right groin hematoma. 2. No bowel obstruction. Normal appendix. 3. Partially visualized small bilateral pleural effusions with partial compressive atelectasis of the lower lobes versus pneumonia. 4. Aortic Atherosclerosis (ICD10-I70.0). Electronically Signed   By: Elgie Collard M.D.   On: 07/16/2021 22:10   PERIPHERAL VASCULAR CATHETERIZATION  Result Date: 08/10/21 See surgical note for result.  DG Chest Port 1 View  Result Date: 07/16/2021 CLINICAL DATA:  Chest pain EXAM: PORTABLE CHEST 1 VIEW COMPARISON:  May 14, 2022 FINDINGS: Numerous radiopaque external lines overlie the chest. Defibrillator leads overlie the left hemithorax. Right IJ CVC with tip overlying the superior cavoatrial junction. Endotracheal tube with tip in the lower thoracic trachea. Enteric tube courses below the diaphragm with side port overlying the stomach and tip obscured by collimation. The heart size and mediastinal contours are within normal limits. Slightly reduced lung volumes with vascular crowding and hazy bilateral airspace opacities favored to reflect dependent atelectasis. No visible pleural effusion or  pneumothorax. The visualized skeletal structures are unremarkable. IMPRESSION: 1. Slightly reduced lung volumes with vascular crowding and hazy bilateral airspace opacities favored to reflect dependent atelectasis. 2. Support lines and tubes as above. Electronically Signed   By: Maudry Mayhew M.D.   On: 07/16/2021 14:58   DG Chest Port 1 View  Result Date: 10-Aug-2021 CLINICAL DATA:  Central line placement EXAM: PORTABLE CHEST 1 VIEW COMPARISON:  None. FINDINGS: Right IJ central venous catheter tip over the cavoatrial region. No visible pneumothorax. No consolidation or effusion. Normal cardiac size. IMPRESSION: Right IJ central venous catheter tip over the cavoatrial region. No pneumothorax Electronically Signed   By: Jasmine Pang M.D.   On: 2021-08-10 21:35   CT ANGIO HEAD NECK W WO CM W PERF (CODE STROKE)  Result Date: 07/17/2021 CLINICAL DATA:  Initial evaluation for acute stroke. EXAM: CT ANGIOGRAPHY HEAD AND NECK TECHNIQUE: Multidetector CT imaging of the head and neck was performed using the standard protocol during bolus administration of intravenous contrast. Multiplanar CT image reconstructions and MIPs were obtained to evaluate the vascular anatomy. Carotid stenosis measurements (when applicable) are obtained utilizing NASCET criteria, using the distal internal carotid diameter as the denominator. RADIATION DOSE REDUCTION: This exam was performed according to the departmental dose-optimization program which includes automated exposure control, adjustment of the mA and/or kV according to patient size and/or use of iterative reconstruction technique. CONTRAST:  75mL OMNIPAQUE IOHEXOL 350 MG/ML SOLN COMPARISON:  Comparison made with prior head CT from 07/16/2021. FINDINGS: CTA NECK FINDINGS Aortic arch: Visualized aortic arch normal in caliber with normal 3 vessel morphology. Moderate atheromatous change about the origin of the great vessels without high-grade stenosis. Right carotid system: Right  CCA patent from its origin to the bifurcation without significant stenosis. Mixed plaque about the right carotid bulb/proximal right ICA with associated stenosis of up to 50% by NASCET criteria. Right ICA patent distally without stenosis or dissection. Left carotid system: Left CCA patent to the bifurcation without significant stenosis. Eccentric calcified plaque about the left carotid bulb/proximal left ICA without hemodynamically significant stenosis. Left ICA patent distally without stenosis or dissection. Vertebral arteries: Both vertebral arteries arise from the subclavian  arteries. No significant proximal subclavian artery stenosis. Right vertebral artery strongly dominant with a diffusely hypoplastic left vertebral artery. Atheromatous change at the origin of the dominant right vertebral artery with moderate ostial stenosis. Vertebral arteries otherwise patent distally without stenosis or dissection. Skeleton: No discrete or worrisome osseous lesions. Mild for age cervical spondylosis. Other neck: Endotracheal and enteric tubes in place. The enteric tube is partially coiled within the oral cavity before coursing inferiorly. 1 right-sided central venous catheter in place. No other acute soft tissue abnormality within the neck. Upper chest: Multifocal pneumonia noted within the partially visualized lungs. Layering bilateral pleural effusions partially visualized as well. Review of the MIP images confirms the above findings CTA HEAD FINDINGS Anterior circulation: Petrous segments patent bilaterally. Atheromatous change within the carotid siphons with associated mild multifocal narrowing. 3 mm left paraophthalmic aneurysm noted (series 7, image 263). A1 segments patent bilaterally. Normal anterior communicating artery complex. Anterior cerebral arteries patent to their distal aspects without stenosis. No M1 stenosis or occlusion. Normal MCA bifurcations. Distal MCA branches perfused and symmetric. Posterior  circulation: Both V4 segments patent without stenosis. Right PICA patent. Left PICA not well seen. Basilar patent proximally. There is partially occlusive thrombus at the basilar tip (series 8, image 135). Superior cerebellar arteries remain patent distally. Both PCAs appear partially occluded at their origins, but remain patent and well perfused distally as well. Venous sinuses: Patent allowing for timing the contrast bolus. Anatomic variants: None significant. 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 Review of the MIP images confirms the above findings IMPRESSION: 1. 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 2. 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 3. 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 4. 3 mm left paraophthalmic aneurysm. 5. Multifocal pneumonia with layering bilateral pleural effusions, partially visualized. Critical Value/emergent results were called by telephone at the time of interpretation on 07/17/2021 at 12:46 am to provider Dr. Karna Christmas, who verbally acknowledged these results. Electronically Signed   By: Rise Mu M.D.   On: 07/17/2021 00:55    Cardiac Studies   Echo pending  Patient Profile     71 y.o. female with diabetes, PAD, prior tobacco abuse, and memory loss admitted for lower extremity angiography.  Cardiology was consulted due to tachycardia and PEA arrest.  Assessment & Plan    # Sinus tachycardia/bradycardia: # PEA arrest: She developed a right groin hematoma postoperatively with extension into the abdominal wall.  She then became hypotensive and tachycardic.  There was concern for SVT versus atrial fibrillation.  She was given adenosine, digoxin, metoprolol, and IV amiodarone.  After review of Dr. Okey Dupre it was determined that this was mostly sinus tachycardia.  Heart rate was very difficult to control and then she  became progressively bradycardic and hypotensive and had a PEA arrest.  She underwent CPR and was intubated.  # Acute systolic and diastolic heart failure: Postarrest LVEF 20%.  Full echo report pending.  Right ventricular function is normal.  She appears to be euvolemic on exam.  # NSTEMI:  Post-arrest high-sensitivity troponin I 177->208->598->3635.  Isolated 1 mm ST elevations in aVR and V1.  There is concern for global ischemia or possible three-vessel disease.  Not currently a candidate for cardiac catheterization after her PEA arrest, blood loss anemia and right groin hematoma.  She will need an ischemic evaluation once more clinically stable.  Maintain hemoglobin >8.  # PAD:  Underwent left anterior tibial angioplasty with mechanical thrombectomy and catheter directed thrombolysis of the left SFA and popliteal arteries.    #A cute CVA: # Basilar tip thrombosis: Patient was noted to have anisocoria.  She had a head CT last night that revealed an acute basilar tip thrombosis.  She also has extensive cerebellar edema.  She has been evaluated by neurology.  Based on the extent of her infarct and her clinical exam they did not think that endovascular intervention would be of clinical benefit.  Intervention would likely result in herniation and death.  She is not a candidate for anticoagulation due to high risk of hemorrhagic conversion and her known abdominal hematoma.  They recommended continued observation and discussions about goals of care with the family.  MRI of the brain pending.  # Multifocal pneumonia: Noted on CT post-arrest.  Currently intubated.  Management per critical care.  Total critical care time: 50 minutes. Critical care time was exclusive of separately billable procedures and treating other patients. Critical care was necessary to treat or prevent imminent or life-threatening deterioration. Critical care was time spent personally by me on the following activities: development  of treatment plan with patient and/or surrogate as well as nursing, discussions with consultants, evaluation of patient's response to treatment, examination of patient, obtaining history from patient or surrogate, ordering and performing treatments and interventions, ordering and review of laboratory studies, ordering and review of radiographic studies, pulse oximetry and re-evaluation of patient's condition.        For questions or updates, please contact [facility] Please consult [email or web address] for contact info under        Signed, Chilton Si, MD  07/17/2021, 7:47 AM

## 2021-07-17 NOTE — Progress Notes (Signed)
°  July 17, 2021  Patient: Carrie Hernandez  Date of Birth: Dec 14, 1950  Date of Visit: 07/14/2021    To Whom It May Concern:  Carrie Hernandez is being treated in our intensive care unit on life support at [facility] located at [address] [place] [address] since  07/14/2021.   Sincerely,  Julienne Kass RN BSN

## 2021-07-17 NOTE — Progress Notes (Signed)
MEDICATION RELATED CONSULT NOTE   Pharmacy Consult for concentrated sodium chloride monitoring Indication: cerebral edema or impending cerebral herniation  No Known Allergies  Patient Measurements: Height: 5\' 4"  (162.6 cm) Weight: 47.6 kg (104 lb 15 oz) IBW/kg (Calculated) : 54.7   Vital Signs: Temp: 101.3 F (38.5 C) (02/11 0900) Temp Source: Oral (02/11 0445) BP: 110/55 (02/11 0900) Pulse Rate: 107 (02/11 0900) Intake/Output from previous day: 02/10 0701 - 02/11 0700 In: 4547.3 [P.O.:100; I.V.:2799.2; Blood:342; IV Piggyback:1306.1] Out: 870 [Urine:870] Intake/Output from this shift: Total I/O In: 607.8 [I.V.:209.3; Blood:390; IV Piggyback:8.5] Out: -   Labs: Recent Labs    07/25/21 2033 07-25-21 2249 07/16/21 0448 07/16/21 0716 07/16/21 1300 07/16/21 1530 07/16/21 1856 07/16/21 1858 07/17/21 0321 07/17/21 1004  WBC 12.1*  --  20.7*  --   --   --   --   --  15.1*  --   HGB 9.1*   < > 9.3* 9.0*   < >  --   --  8.2* 7.4* 9.1*  HCT 27.2*   < > 27.5* 26.5*   < >  --   --  25.0* 21.7* 26.3*  PLT 283   292  --  215  --   --   --   --   --  121*  --   APTT 38*  --   --   --   --   --   --   --   --   --   CREATININE  --    < > 0.96 0.80  --  1.11*  --   --  1.21*  --   MG  --   --  1.8  --   --   --  2.3  --  2.0  --   PHOS  --   --   --   --   --   --  7.8*  --  5.0*  --   ALBUMIN  --   --   --   --   --  1.7*  --   --   --   --   PROT  --   --   --   --   --  <3.0*  --   --   --   --   AST  --   --   --   --   --  459*  --   --   --   --   ALT  --   --   --   --   --  409*  --   --   --   --   ALKPHOS  --   --   --   --   --  50  --   --   --   --   BILITOT  --   --   --   --   --  0.4  --   --   --   --    < > = values in this interval not displayed.   Estimated Creatinine Clearance: 31.6 mL/min (A) (by C-G formula based on SCr of 1.21 mg/dL (H)).   Microbiology: Recent Results (from the past 720 hour(s))  Resp Panel by RT-PCR (Flu A&B, Covid)  Nasopharyngeal Swab     Status: None   Collection Time: 07/25/2021  7:26 PM   Specimen: Nasopharyngeal Swab; Nasopharyngeal(NP) swabs in vial transport medium  Result Value Ref Range Status  SARS Coronavirus 2 by RT PCR NEGATIVE NEGATIVE Final    Comment: (NOTE) SARS-CoV-2 target nucleic acids are NOT DETECTED.  The SARS-CoV-2 RNA is generally detectable in upper respiratory specimens during the acute phase of infection. The lowest concentration of SARS-CoV-2 viral copies this assay can detect is 138 copies/mL. A negative result does not preclude SARS-Cov-2 infection and should not be used as the sole basis for treatment or other patient management decisions. A negative result may occur with  improper specimen collection/handling, submission of specimen other than nasopharyngeal swab, presence of viral mutation(s) within the areas targeted by this assay, and inadequate number of viral copies(<138 copies/mL). A negative result must be combined with clinical observations, patient history, and epidemiological information. The expected result is Negative.  Fact Sheet for Patients:  BloggerCourse.com  Fact Sheet for Healthcare Providers:  SeriousBroker.it  This test is no t yet approved or cleared by the [place] FDA and  has been authorized for detection and/or diagnosis of SARS-CoV-2 by FDA under an Emergency Use Authorization (EUA). This EUA will remain  in effect (meaning this test can be used) for the duration of the COVID-19 declaration under Section 564(b)(1) of the Act, 21 U.S.C.section 360bbb-3(b)(1), unless the authorization is terminated  or revoked sooner.       Influenza A by PCR NEGATIVE NEGATIVE Final   Influenza B by PCR NEGATIVE NEGATIVE Final    Comment: (NOTE) The Xpert Xpress SARS-CoV-2/FLU/RSV plus assay is intended as an aid in the diagnosis of influenza from Nasopharyngeal swab specimens and should not be  used as a sole basis for treatment. Nasal washings and aspirates are unacceptable for Xpert Xpress SARS-CoV-2/FLU/RSV testing.  Fact Sheet for Patients: BloggerCourse.com  Fact Sheet for Healthcare Providers: SeriousBroker.it  This test is not yet approved or cleared by the [place] FDA and has been authorized for detection and/or diagnosis of SARS-CoV-2 by FDA under an Emergency Use Authorization (EUA). This EUA will remain in effect (meaning this test can be used) for the duration of the COVID-19 declaration under Section 564(b)(1) of the Act, 21 U.S.C. section 360bbb-3(b)(1), unless the authorization is terminated or revoked.  Performed at [facility], [address] [place], [place] [address]   MRSA Next Gen by PCR, Nasal     Status: None   Collection Time: 07/22/2021  7:26 PM   Specimen: Nasal Mucosa; Nasal Swab  Result Value Ref Range Status   MRSA by PCR Next Gen NOT DETECTED NOT DETECTED Final    Comment: (NOTE) The GeneXpert MRSA Assay (FDA approved for NASAL specimens only), is one component of a comprehensive MRSA colonization surveillance program. It is not intended to diagnose MRSA infection nor to guide or monitor treatment for MRSA infections. Test performance is not FDA approved in patients less than 70 years old. Performed at [facility], [address] [place], [place] [address]   Urine Culture     Status: None (Preliminary result)   Collection Time: 07/16/21  4:48 AM   Specimen: Urine, Catheterized  Result Value Ref Range Status   Specimen Description   Final    URINE, CATHETERIZED Performed at [facility], [address] [place], [place] [address]    Special Requests   Final    NONE Performed at [facility], [address] [place], [place] [address]    Culture   Final    CULTURE REINCUBATED FOR BETTER GROWTH Performed at [facility], [address]  [place], [place] [address]    Report Status PENDING  Incomplete    Medical History: Past Medical History:  Diagnosis Date   Diabetes mellitus without complication (HCC)     Medications:  Medications Prior to Admission  Medication Sig Dispense Refill Last Dose   aspirin EC 81 MG tablet Take 1 tablet (81 mg total) by mouth daily. 150 tablet 2 07/14/2021 at 0800   atorvastatin (LIPITOR) 10 MG tablet Take 1 tablet (10 mg total) by mouth daily. 30 tablet 11 07/14/2021 at 0800   clopidogrel (PLAVIX) 75 MG tablet Take 1 tablet (75 mg total) by mouth daily. 30 tablet 11 07/14/2021 at 0800   gabapentin (NEURONTIN) 300 MG capsule Take 1 capsule (300 mg total) by mouth at bedtime. 30 capsule 3 07/14/2021   insulin glargine (LANTUS) 100 UNIT/ML injection 7 units before breakfast (Patient taking differently: Inject 10 Units into the skin 2 (two) times daily. 7 units before breakfast) 10 mL 3 2021/08/11 at 0800   metFORMIN (GLUCOPHAGE) 500 MG tablet Take 500 mg by mouth 2 (two) times daily with a meal.   07/14/2021 at 0800   traZODone (DESYREL) 50 MG tablet Take 50 mg by mouth at bedtime.   prn   BD INSULIN SYRINGE U/F 31G X 5/16" 0.3 ML MISC See admin instructions.      glucose blood (PRECISION QID TEST) test strip 1 each (1 strip total) 3 (three) times daily To check blood sugars Dx: E11.9      glyBURIDE (DIABETA) 5 MG tablet Take 5 mg by mouth every morning.   07/14/2021 at 0800   Scheduled:   sodium chloride   Intravenous Once   sodium chloride   Intravenous Once   sodium chloride   Intravenous Once   atorvastatin  10 mg Per Tube QHS   Chlorhexidine Gluconate Cloth  6 each Topical Q0600   docusate  100 mg Per Tube BID   gabapentin  300 mg Oral QHS   insulin aspart  0-15 Units Subcutaneous Q4H   insulin glargine-yfgn  10 Units Subcutaneous BID   polyethylene glycol  17 g Per Tube Daily   sodium chloride flush  10-40 mL Intracatheter Q12H   sodium chloride flush  3 mL Intravenous Q12H   traZODone   50 mg Oral QHS   Infusions:   sodium chloride Stopped (07/16/21 1541)   sodium chloride Stopped (07/16/21 1541)   albumin human Stopped (07/17/21 0708)   cefTRIAXone (ROCEPHIN)  IV Stopped (07/17/21 5009)   fentaNYL infusion INTRAVENOUS Stopped (07/16/21 2241)   norepinephrine (LEVOPHED) Adult infusion 4 mcg/min (07/17/21 1022)   phenylephrine (NEO-SYNEPHRINE) Adult infusion 30 mcg/min (07/17/21 0900)    sodium bicarbonate (isotonic) infusion in sterile water 100 mL/hr at 07/17/21 0900   sodium chloride (hypertonic)     sodium chloride 3% (hypertonic) 250 mL (07/17/21 1016)   vasopressin Stopped (07/17/21 0709)   PRN: sodium chloride, acetaminophen, fentaNYL, hydrALAZINE, midazolam, midazolam, ondansetron (ZOFRAN) IV, ondansetron (ZOFRAN) IV, sodium chloride flush, sodium chloride flush  Assessment: 70YOF presented for angiography. After procedure noted to be lethargic, then developed PEA arrest. CT 2/10 revealed an "acute basilar tip thrombosis. Also has extensive cerebellar edema"  Goal of Therapy:  Na goal 150-155   Plan:  Given hypertonic IV bolus 250 mL Hypertonic 3% infusion @75  mL/hr Sodium levels every 6 hours  Follow up sodium level PRN with provider.   , PharmD Pharmacy Resident  07/17/2021 10:43 AM

## 2021-07-17 NOTE — Significant Event (Signed)
Pt's heart rate increased to the 130s and O2 sats down to mid 70s. NP Domingo Pulse to bedside, pt given 100 mg fentanyl, 2 mg versed and 10 mg vecuronium. Vent settings adjusted, sats now is upper 80s heart rate unchanged. BP maintaining as of this time. Family updated on pt condition, will be coming for meeting to discuss [place]

## 2021-07-17 NOTE — Consult Note (Signed)
ANTICOAGULATION CONSULT NOTE - Initial Consult  Pharmacy Consult for heparin Indication: stroke  No Known Allergies  Patient Measurements: Height: 5\' 4"  (162.6 cm) Weight: 47.6 kg (104 lb 15 oz) IBW/kg (Calculated) : 54.7 Heparin Dosing Weight: 47.6 kg  Vital Signs: Temp: 98.2 F (36.8 C) (02/11 1456) Temp Source: Axillary (02/11 1456) BP: 112/64 (02/11 1815) Pulse Rate: 112 (02/11 1815)  Labs: Recent Labs    07/20/2021 2033 07/08/2021 2249 07/16/21 0448 07/16/21 0639 07/16/21 0716 07/16/21 1001 07/16/21 1530 07/16/21 1856 07/16/21 1858 07/16/21 2259 07/17/21 0321 07/17/21 1004 07/17/21 1556  HGB 9.1*   < > 9.3*  --  9.0*   < >  --   --    < >  --  7.4* 9.1* 8.8*  HCT 27.2*   < > 27.5*  --  26.5*   < >  --   --    < >  --  21.7* 26.3* 26.0*  PLT 283   292  --  215  --   --   --   --   --   --   --  121*  --   --   APTT 38*  --   --   --   --   --   --   --   --   --   --   --   --   LABPROT 16.7*  --   --   --   --   --   --   --   --   --   --   --   --   INR 1.4*  --   --   --   --   --   --   --   --   --   --   --   --   CREATININE  --    < > 0.96  --  0.80  --  1.11*  --   --   --  1.21*  --   --   TROPONINIHS  --   --   --    < >  --    < > 3,635* 13,952*  --  >24,000*  --   --   --    < > = values in this interval not displayed.    Estimated Creatinine Clearance: 31.6 mL/min (A) (by C-G formula based on SCr of 1.21 mg/dL (H)).   Medical History: Past Medical History:  Diagnosis Date   Diabetes mellitus without complication (HCC)     Medications:  No PTA anticoagulation, only Plavix 75 mg daily and aspirin 81 mg daily  Assessment: 71 y.o female PMH of Diabetes Mellitus, Memory Loss and PAD with extensive BLE revascularization who presented to the hospital for elective Left Lower Extremity Angiography. Following surgery patient developed right groin hematoma. 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 Pharmacy has been  consulted for heparin dosing per stroke protocol.   Baseline labs: hgb 8.8, plts, 121, aPTT 38, INR 1.4  Goal of Therapy:  Heparin level 0.3-0.5 units/ml Monitor platelets by anticoagulation protocol: Yes   Plan:  Start heparin infusion at 600 units/hr Check anti-Xa level in 8 hours and daily while on heparin Continue to monitor H&H and platelets  4/11, PharmD 07/17/2021,6:29 PM

## 2021-07-17 NOTE — Progress Notes (Signed)
NAME:  Carrie Hernandez, MRN:  [id number], DOB:  1950-07-05, LOS: 2 ADMISSION DATE:  07/10/2021, CONSULTATION DATE:  07/20/2021 REFERRING MD:  Leotis Pain, MD CHIEF COMPLAINT:  Right Groin Hematoma    HPI  71 y.o Hispanic female with significant PMH of Diabetes Mellitus, Memory Loss and PAD with extensive BLE revascularization who presented to the hospital for elective Left Lower Extremity Angiography.  Hospital Course: Patient underwent Angiography of the Left Lower Extremity Angiography from the right groin femoral approach. Following completion of surgery, the sheath was removed and StarClose closure device was deployed in the right femoral artery with excellent hemostatic result. The patient was taken to the recovery room in stable condition having tolerated the procedure well. Patient was later transferred to the ICU for close monitoring.  On arrival to the ICU, patient was noted with significant right groin pain and swelling.  She was diaphoretic and appeared uncomfortable.  Vital signs were: temperature was 37.1C, the heart rate 139 beats/minute, the blood pressure 76/56 mm Hg, the respiratory rate 23 breaths/minute, and the oxygen saturation 100% on 2L. She was lethargic, moaned intermittently, and responded with one-word answers; symmetric movement in the arms and legs was observed. Manual compression was applied at the groin site for 20 minutes, patient started on IVFs resuscitation and STAT labs obtained. PCCM consulted   07/16/21-patient had cardiac arrest today.  She was in shock and was noted to tachyarrythmia with asystole.  She had ACLS with ROSC and had cardiology consult.   07/17/21- Patient remains critically ill.  She had new findings of embolic stroke which is not amenable to intervention from neuro interventional perspective. She had cardiology evaluation today and yesterday with bedside TTE showing LVEF ~20% and seems to have coagulopathy due to findings of previous DVT and now  showering emboli.  She may have undiagnosed malignancy as culprit of current severe embolic phenomenon from long hx of lifelong smoking.  We had long family meeting today with numerous family members.  She had MRI of brain today and neurologist was able to review that with them as well. She is with very poor prognosis and has numerous and severe comorbid conditions.   Past Medical History  Diabetes Mellitus, Memory Loss and PAD with extensive BLE revascularization  Significant Hospital Events   2/9: Presented  for elective  surgery s/p Angiography of the Left Lower Extremity Angiography from the right groin femoral approach. Transferred to ICU post op.  Consults:  PCCM Vascular  Procedures:  2/9: s/p Angiography of the Left Lower Extremity Angiography from the right groin femoral approach  Significant Diagnostic Tests:  2/9: CTA abdomen and pelvis>. Large hyperdense collection/hematoma primarily within the subcutaneous soft tissues superficial to the right lower quadrant abdominal wall and rectus, this extends to the right flank region above the level of umbilicus and inferiorly to the proximal thigh with the inferior extent incompletely visualized. There is a lobulated focal hyperdense collection/hematoma in the right groin anterior to the common femoral vessels consistent with groin hematoma. Correlation with sonography could be obtained to assess for pseudoaneurysm.  Micro Data:  2/9: SARS-CoV-2 PCR> negative 2/9: Influenza PCR> negative  Antimicrobials:  None  OBJECTIVE  Blood pressure (!) 113/56, pulse (!) 108, temperature (!) 102 F (38.9 C), resp. rate (!) 24, height _0  (1.626 m), weight 47.6 kg, SpO2 90 %. CVP:  [0 mmHg-2 mmHg] 2 mmHg  Vent Mode: PRVC FiO2 (%):  [30 %-60 %] 60 % Set Rate:  [20 bmp] 20  bmp Vt Set:  [400 mL] 400 mL PEEP:  [5 cmH20] 5 cmH20 Plateau Pressure:  [20 cmH20] 20 cmH20   Intake/Output Summary (Last 24 hours) at 07/17/2021 1302 Last data filed at  07/17/2021 1000 Gross per 24 hour  Intake 4487.04 ml  Output 560 ml  Net 3927.04 ml    Filed Weights   07/29/2021 1305 07/29/2021 1910  Weight: 49.9 kg 47.6 kg   Physical Examination  GENERAL: Age appropriate EYES: Pupils equal, round, reactive to light and accommodation. No scleral icterus. Extraocular muscles intact.  HEENT: Head atraumatic, normocephalic. Oropharynx and nasopharynx clear. +ett NECK:  Supple, no jugular venous distention. No thyroid enlargement, no tenderness.  LUNGS: Normal breath sounds bilaterally, no wheezing, rales,rhonchi or crepitation. No use of accessory muscles of respiration.  CARDIOVASCULAR: S1, S2 normal. No murmurs, rubs, or gallops.  ABDOMEN: Soft, nontender, nondistended. Bowel sounds present. No organomegaly or mass.  EXTREMITIES: No pedal edema, cyanosis, or clubbing. Right groin hematoma/bruising NEUROLOGIC: gcs 4T PSYCHIATRIC: unable to perform due to acutely comatose state  SKIN: No obvious rash, lesion, or ulcer.   Labs/imaging that I havepersonally reviewed  (right click and "Reselect all SmartList Selections" daily)     Labs   CBC: Recent Labs  Lab 07/14/2021 2033 07/16/21 0224 07/16/21 0448 07/16/21 0716 07/16/21 1300 07/16/21 1858 07/17/21 0321 07/17/21 1004  WBC 12.1*  --  20.7*  --   --   --  15.1*  --   NEUTROABS 10.1*  --   --   --   --   --  13.1*  --   HGB 9.1*   < > 9.3* 9.0* 8.6* 8.2* 7.4* 9.1*  HCT 27.2*   < > 27.5* 26.5* 26.3* 25.0* 21.7* 26.3*  MCV 92.2  --  92.0  --   --   --  92.7  --   PLT 283   292  --  215  --   --   --  121*  --    < > = values in this interval not displayed.     Basic Metabolic Panel: Recent Labs  Lab 07/22/2021 2249 07/16/21 0448 07/16/21 0716 07/16/21 1530 07/16/21 1856 07/17/21 0321 07/17/21 1004  NA 135 134* 136 142  --  134* 135  K 4.4 5.4* 5.1 4.5  --  4.1  --   CL 105 103 104 104  --  99  --   CO2 18* 19* 18* 17*  --  26  --   GLUCOSE 292* 318* 326* 251*  --  156*  --   BUN  _0 25*  --  29*  --   CREATININE 0.90 0.96 0.80 1.11*  --  1.21*  --   CALCIUM 7.5* 7.6* 7.6* 7.8*  --  7.0*  --   MG  --  1.8  --   --  2.3 2.0  --   PHOS  --   --   --   --  7.8* 5.0*  --     GFR: Estimated Creatinine Clearance: 31.6 mL/min (A) (by C-G formula based on SCr of 1.21 mg/dL (H)). Recent Labs  Lab 08/02/2021 2033 07/28/2021 2249 07/16/21 0448 07/16/21 0716 07/16/21 1856 07/16/21 2259 07/17/21 0321 07/17/21 0456 07/17/21 1004  WBC 12.1*  --  20.7*  --   --   --  15.1*  --   --   LATICACIDVEN  --    < > 4.7*   < > >9.0* 7.6*  --  5.2* 3.2*   < > = values in this interval not displayed.     Liver Function Tests: Recent Labs  Lab 07/16/21 1530  AST 459*  ALT 409*  ALKPHOS 50  BILITOT 0.4  PROT <3.0*  ALBUMIN 1.7*   No results for input(s): LIPASE, AMYLASE in the last 168 hours. No results for input(s): AMMONIA in the last 168 hours.  ABG    Component Value Date/Time   PHART 7.36 07/17/2021 0349   PCO2ART 46 07/17/2021 0349   PO2ART PENDING 07/17/2021 0349   HCO3 26.0 07/17/2021 0349   ACIDBASEDEF 11.2 (H) 07/16/2021 1726   O2SAT 44.7 07/17/2021 0349     Coagulation Profile: Recent Labs  Lab 08/02/2021 2033  INR 1.4*     Cardiac Enzymes: No results for input(s): CKTOTAL, CKMB, CKMBINDEX, TROPONINI in the last 168 hours.  HbA1C: Hgb A1c MFr Bld  Date/Time Value Ref Range Status  07/16/2021 06:39 AM 7.7 (H) 4.8 - 5.6 % Final    Comment:    (NOTE) Pre diabetes:          5.7%-6.4%  Diabetes:              >6.4%  Glycemic control for   <7.0% adults with diabetes     CBG: Recent Labs  Lab 07/16/21 1913 07/16/21 2245 07/17/21 0331 07/17/21 0724 07/17/21 1056  GLUCAP 186* 178* 125* 109* 107*     Review of Systems:   UNABLE TO ASSESS DUE TO AMS  Past Medical History  She,  has a past medical history of Diabetes mellitus without complication ([employer]).   Surgical History    Past Surgical History:  Procedure Laterality Date    LOWER EXTREMITY ANGIOGRAPHY Right 01/11/2021   Procedure: LOWER EXTREMITY ANGIOGRAPHY;  Surgeon: Algernon Huxley, MD;  Location: [facility];  Service: Cardiovascular;  Laterality: Right;   LOWER EXTREMITY ANGIOGRAPHY Left 04/05/2021   Procedure: LOWER EXTREMITY ANGIOGRAPHY;  Surgeon: Algernon Huxley, MD;  Location: [facility];  Service: Cardiovascular;  Laterality: Left;   LOWER EXTREMITY ANGIOGRAPHY Left 07/12/2021   Procedure: Lower Extremity Angiography;  Surgeon: Algernon Huxley, MD;  Location: [facility];  Service: Cardiovascular;  Laterality: Left;     Social History   reports that she has quit smoking. Her smoking use included cigarettes. She smoked an average of .5 packs per day. She has never used smokeless tobacco. She reports that she does not drink alcohol.   Family History   Her family history includes Diabetes in her maternal aunt, maternal uncle, and mother; Heart attack in her brother; Lung cancer in her brother.   Allergies No Known Allergies   Home Medications  Prior to Admission medications   Medication Sig Start Date End Date Taking? Authorizing Provider  apixaban (ELIQUIS) 5 MG TABS tablet Take 1 tablet (5 mg total) by mouth 2 (two) times daily. 08/03/2021  Yes Algernon Huxley, MD  aspirin EC 81 MG tablet Take 1 tablet (81 mg total) by mouth daily. 01/11/21  Yes Dew, Erskine Squibb, MD  atorvastatin (LIPITOR) 10 MG tablet Take 1 tablet (10 mg total) by mouth daily. 01/11/21 01/11/22 Yes Dew, Erskine Squibb, MD  clopidogrel (PLAVIX) 75 MG tablet Take 1 tablet (75 mg total) by mouth daily. 01/11/21  Yes Dew, Erskine Squibb, MD  gabapentin (NEURONTIN) 300 MG capsule Take 1 capsule (300 mg total) by mouth at bedtime. 05/05/21  Yes Kris Hartmann, NP  insulin glargine (LANTUS) 100 UNIT/ML  injection 7 units before breakfast Patient taking differently: Inject 7 Units into the skin. 7 units before breakfast 10/02/19 07/29/2021 Yes Triplett, Cari B, FNP  metFORMIN (GLUCOPHAGE) 500 MG tablet Take by  mouth. 10/11/19  Yes [provider]  traZODone (DESYREL) 50 MG tablet Take 50 mg by mouth at bedtime. 11/20/20  Yes [provider]  atorvastatin (LIPITOR) 10 MG tablet Take 1 tablet by mouth daily. 01/11/21 01/11/22  [provider]  BD INSULIN SYRINGE U/F 31G X 5/16" 0.3 ML MISC See admin instructions. 12/14/20   [provider]  glucose blood (PRECISION QID TEST) test strip 1 each (1 strip total) 3 (three) times daily To check blood sugars Dx: E11.9 11/24/20   [provider]  Scheduled Meds:  sodium chloride   Intravenous Once   sodium chloride   Intravenous Once   sodium chloride   Intravenous Once   atorvastatin  10 mg Per Tube QHS   Chlorhexidine Gluconate Cloth  6 each Topical Q0600   docusate  100 mg Per Tube BID   gabapentin  300 mg Oral QHS   insulin aspart  0-15 Units Subcutaneous Q4H   insulin glargine-yfgn  10 Units Subcutaneous BID   polyethylene glycol  17 g Per Tube Daily   sodium chloride flush  10-40 mL Intracatheter Q12H   sodium chloride flush  3 mL Intravenous Q12H   traZODone  50 mg Oral QHS   Continuous Infusions:  sodium chloride Stopped (07/16/21 1541)   sodium chloride Stopped (07/16/21 1541)   albumin human Stopped (07/17/21 0708)   cefTRIAXone (ROCEPHIN)  IV Stopped (07/17/21 2536)   fentaNYL infusion INTRAVENOUS Stopped (07/16/21 2241)   norepinephrine (LEVOPHED) Adult infusion Stopped (07/17/21 1119)   phenylephrine (NEO-SYNEPHRINE) Adult infusion 30 mcg/min (07/17/21 1249)    sodium bicarbonate (isotonic) infusion in sterile water 100 mL/hr at 07/17/21 1252   sodium chloride (hypertonic) 75 mL/hr at 07/17/21 1259   vasopressin Stopped (07/17/21 0709)   PRN Meds:.sodium chloride, acetaminophen, fentaNYL, hydrALAZINE, midazolam, midazolam, ondansetron (ZOFRAN) IV, ondansetron (ZOFRAN) IV, sodium chloride flush, sodium chloride flush   Active Hospital Problem list     Assessment & Plan:   Acute Blood Loss Anemia  Secondary to Right Groin Hematoma post sheath removal Hemorrhagic Shock-  -repeat CT abdomen 07/16/21 shows improvement in hematoma -h/h was >9 even after initial CT chest and this was after IV fluid so it is difficult to say that shock was solely due to bleeding. She has very low EF on TTE and this may coupled with long standing DM is a high risk for cardiomyopathy and CAD -currently on PRVC -CT abdomen/Pelvis negative for obvious pseudoaneurysm or retroperitoneal hematoma, -IVF resuscitation and Vasopressors to maintain MAP>65 -H&H monitoring q6h -Obtain STAT CBC, CMP, DIC Panel, Lactate -Transfuse PRN Hgb<8 -NPO for now -Hold NSAIDs, steroids, ASA  Cardiac arrest s/p ACLS   Patient had ROSC post CPR    See procedure note for code blue   - patient had bedside TTE - LVEF 20%   -I met with family and discussed in details in [address] to son and daughter the findings and medical plan via interperter Rafael    - updated vascular surgery -   - reviewed plan with cardiology team - appreciate input  -  patient is in NSR now     Severe PAD with rest pain left lower extremity S/p Lower Extremity Angiography complicated by right groin Hematoma post sheath removal PMHx PAD with extensive BLE revascularization -Post direct  proximal hemostatic pressure x 20 minutes -Hold Tirofiban (Aggrastat) -PRN pain management -Management per Vascular,discussed CT findings with vascular on call, per Dr. Lucky Cowboy no urgent need for intervention, continue to apply pressure with possible return to OR in the am.   Acute Encephalopathy multifactorial in the setting of above and underlying Dementia -Provide supportive care   Diabetes mellitus Diabetic Peripheral Neuropathy -CBGs -Sliding scale insulin -Follow ICU hyper/hypoglycemia protocol -Hold Gabapentin   Anxiety and Depression -Hold trazodone while NPO   Best practice:  Diet:  NPO Pain/Anxiety/Delirium protocol (if indicated): No VAP protocol (if  indicated): Not indicated DVT prophylaxis: Contraindicated GI prophylaxis: PPI Glucose control:  SSI Yes Central venous access:  Yes, and it is still needed Arterial line:  N/A Foley:  Yes, and it is still needed Mobility:  bed rest  PT consulted: N/A Last date of multidisciplinary goals of care discussion [2/9] Code Status:  full code Disposition: ICU   = Goals of Care = Code Status Order: FULL  Primary Emergency Contact: Palla,Ana updated patient's family at the bedside Wishes to pursue full aggressive treatment and intervention options, including CPR and intubation, but goals of care will be addressed on going with family if that should become necessary.   Critical care provider statement:   Total critical care time: 109 minutes   Performed by: Lanney Gins MD   Critical care time was exclusive of separately billable procedures and treating other patients.   Critical care was necessary to treat or prevent imminent or life-threatening deterioration.   Critical care was time spent personally by me on the following activities: development of treatment plan with patient and/or surrogate as well as nursing, discussions with consultants, evaluation of patient's response to treatment, examination of patient, obtaining history from patient or surrogate, ordering and performing treatments and interventions, ordering and review of laboratory studies, ordering and review of radiographic studies, pulse oximetry and re-evaluation of patient's condition.    Ottie Glazier, M.D.  Pulmonary & Critical Care Medicine      .

## 2021-07-17 NOTE — Progress Notes (Signed)
Pt was transported to MRI and back to CCU while on the vent. ° °

## 2021-07-17 NOTE — Significant Event (Signed)
Pt's temp now 103 axillary, Tylenol given and ice packs applied.

## 2021-07-17 NOTE — Progress Notes (Signed)
RT update: Notable change in patient's condition. Large amount of pink frothy secretions in patient's vent hose. O2 sats low. Unable to obtain an abg as not able to palpate pulses from multiple sites. Vent changes made per NP. Will continue to monitor.

## 2021-07-17 NOTE — Consult Note (Signed)
[facility] TeleNeurology Consult Services  Stat Consult  Patient Name:   Carrie Hernandez, Carrie Hernandez Date of Birth:   1950-10-24 Identification Number:   MRN - [id number] Date of Service:   07/16/2021 22:38:39  Diagnosis:       I63.9 - Cerebrovascular accident (CVA), unspecified mechanism (HCC)  Impression 71 yo woman who is currently in the ICU after a PEA arrest today. She was noted to be unresponsive with anisocoria, prompting imaging which has shown obvious extensive strokes in the posterior circulation. CTA showed a basilar tip thrombosis.  I discussed her case with neurointerventional at [facility] and we looked at the imaging together. Because of the extent of her infarct and her clinical exam, it was decided that endovascular intervention would likely not lead to clinical benefit. This is independent of her other concurrent medical issues which make intervention and recovery even more difficult.  Anticoagulation in this setting would have much higher chance of hemorrhagic conversion, on top of the known abdominal hematoma.  In this situation, it is reasonable to continue monitoring her exam and if she does not improve, then would advise discussion about goals of care and potential comfort measures.  Our recommendations are outlined below.  Diagnostic Studies: Recommend MRI brain without contrast  Laboratory Studies: Recommend Lipid panel Hemoglobin A1c  Nursing Recommendations: Telemetry, IV Fluids, avoid dextrose containing fluids, Maintain euglycemia Neuro checks q4 hrs x 24 hrs and then per shift Head of bed 30 degrees  Consultations: Recommend Speech therapy if failed dysphagia screen Physical therapy/Occupational therapy  Disposition: Neurology will follow    ---------------------------------------------------------------------------------------------------- Advanced Imaging: CTA Head and Neck Completed.  LVO:Yes  Discussed with  NIR:Yes  Discussed with NIR Time:07/17/2021 01:01:33  Discussed with NIR Text:  please see impression    Metrics: [facility] Notification Time: 07/16/2021 22:38:39 Stamp Time: 07/16/2021 22:38:39 Callback Response Time: 07/16/2021 22:42:54   CT HEAD: Reviewed    ----------------------------------------------------------------------------------------------------  Chief Complaint: unresponsive  History of Present Illness: Patient is a 71 year old Female. This is a 71 yo woman who presented for planned angioplasty of the left anterior tibial artery and thrombectomy of the left SFA. This was complicated by extensive hematoma from the groin into the abdomen. She became hypotensive, bradycardic, and eventually had a PEA arrest.  ROSC achieved, she was intubated. She remains in the ICU and has been on a fentanyl gtt throughout the day.  A head CT was obtained due to anisocoria. This showed hypodensities in the cerebellar hemispheres, the left thalamus, and the brainstem.  CTA was recommended due to concern for a basilar artery pathology.  She did receive contrast earlier for a CTA of the chest and pelvis.  On exam, fentanyl has been held for approx 30 mins and she has essentially no response to pain. She does breathe over the vent and there are corneal reflexes. The left pupil is approx 75mm, nonreactive, the right pupil is 50mm and sluggishly reactive.     Past Medical History:      Coronary Artery Disease  Medications:  Anticoagulant use:  Yes eliquis Antiplatelet use: Yes asa, plav Reviewed EMR for current medications  Allergies:  NKDA  Social History: Unable To Obtain Due To Patient Status : Patient Is Obtunded/ Comatose  Family History:  Family History Cannot Be Obtained Because:Patient Is Obtunded/ Comatose There is no family history of premature cerebrovascular disease pertinent to this consultation  ROS : ROS Cannot Be Obtained Because:  Patient Is  Obtunded/ Comatose  Past Surgical History: There  Is No Surgical History Contributory To Todays Visit    Examination: BP(121/66), Pulse(112), Blood Glucose(251)   Coma Exam:  Ventilator:  Yes Sedated: Yes Paralyzed: No Breathes spontaneously above ventilator rate: Yes  Responds to Voice:  No  Responds to Sternal rub:  No  Withdraws limb from painful stimulation:  No  Spontaneous limb movement:  Right arm: No Left arm: No Right leg: No Left leg: No  Pupils:  Unequal Left: 91mm Right: 49mm left does not constrict, right is sluggish  Corneal Reflexes:  Present  Cough/Gag Reflexes:  Present    Patient / Family was informed the Neurology Consult would occur via TeleHealth consult by way of interactive audio and video telecommunications and consented to receiving care in this manner.  Patient is being evaluated for possible acute neurologic impairment and high probability of imminent or life - threatening deterioration.I spent total of 50 minutes providing care to this patient, including time for face to face visit via telemedicine, review of medical records, imaging studies and discussion of findings with providers, the patient and / or family.   Dr Parthenia Ames   [facility] [phone]  Case [id number]

## 2021-07-17 NOTE — Consult Note (Signed)
Neurology Consultation  Reason for Consult: Stroke, top of the basilar thrombus, not deemed amenable for IV or IV thrombolysis. Referring Physician: Dr. Lucky Cowboy  CC: Unresponsiveness, stroke on CT, top of the basilar thrombus on CTA evaluated by telemedicine neurology and neuro interventional radiology and not deemed candidate for intervention.  History is obtained from: Chart, family at bedside via [place] interpreter in person.  HPI: Carrie Hernandez is a 71 y.o. female past medical history of peripheral arterial disease with extensive bilateral lower extremity revascularization, diabetes, memory loss at baseline, who presented to the hospital for elective left lower extremity angiography and following the procedure transferred to the ICU for close monitoring on 07/15/2020, who unfortunately had a complication of right groin hematoma with CT showing extensive hematoma extending from the groin to the abdominal wall.  In the ICU, she had hypotension and tachycardia along with concern for atrial fibrillation or SVT for which cardiology was consulted.  Cortical team administered adenosine, digoxin, metoprolol and IV amiodarone with only transient drops in her heart rate and then she suddenly became progressively bradycardic with hypotension and had a cardiac arrest with PEA.  She had CPR and ROSC was achieved.  She required endotracheal intubation and mechanical ventilation. Overnight tonight, critical care provider was called around 10 PM yesterday for anisocoria with left pupil 5 mm nonreactive and right pupil 3 mm sluggish.  She was on a fentanyl drip and on mechanical ventilator support.  Went in for stat CT head which showed abnormal low-attenuation in the superior cerebellum in addition to abnormal low-density within the pons and patchy low-density in the brainstem and left thalamus with findings suspicious for a basilar thrombus.  This was followed by a CT angiogram that showed a top of the basilar  thrombus along with multifocal pneumonia with layering bilateral pleural effusions.  A stat code stroke consult with telemedicine neurology was performed.  Case was discussed by the telemedicine neurologist with the neuro interventional radiologist and given the extensive strokes already established in the brainstem and cerebellum based on the CT, deemed not to be a candidate for mechanical thrombectomy.  She is not a candidate for anticoagulation given the extensive hematoma in the groin and the abdominal wall. Inpatient neurology was consulted for further recommendations on management. 2 granddaughters were at bedside.  Spanish interpreter in person was used to confirm the above history and explain findings of the examination. Patient at baseline requires a walker to walk due to extensive lower extremity circulation problems according to the family.  Also has had memory issues.  LKW: Unclear tpa given?: no, extensive groin and abdominal wall thrombus and outside the window Premorbid modified Rankin scale (mRS): 4  ROS: Unable to obtain due to her being intubated and unresponsive  Past Medical History:  Diagnosis Date   Diabetes mellitus without complication ([place])     Family History  Problem Relation Age of Onset   Diabetes Mother    Lung cancer Brother    Heart attack Brother    Diabetes Maternal Aunt    Diabetes Maternal Uncle      Social History:   reports that she has quit smoking. Her smoking use included cigarettes. She smoked an average of .5 packs per day. She has never used smokeless tobacco. She reports that she does not drink alcohol. No history on file for drug use.  Medications  Current Facility-Administered Medications:    0.9 %  sodium chloride infusion (Manually program via Guardrails IV Fluids), , Intravenous, Once,  Lang Snow, NP   0.9 %  sodium chloride infusion (Manually program via Guardrails IV Fluids), , Intravenous, Once, [place], Bing Neighbors,  NP   0.9 %  sodium chloride infusion (Manually program via Guardrails IV Fluids), , Intravenous, Once, Teressa Lower, NP, Stopped at 07/16/21 1610   0.9 %  sodium chloride infusion, 250 mL, Intravenous, PRN, Algernon Huxley, MD, Stopped at 07/16/21 1541   0.9 %  sodium chloride infusion, 250 mL, Intravenous, Continuous, Tawnya Crook, RPH, Stopped at 07/16/21 1541   acetaminophen (TYLENOL) tablet 650 mg, 650 mg, Per Tube, Q4H PRN, Teressa Lower, NP   albumin human 25 % solution 25 g, 25 g, Intravenous, Q6H, Rust-Chester, Huel Cote, NP, Stopped at 07/17/21 0708   atorvastatin (LIPITOR) tablet 10 mg, 10 mg, Per Tube, QHS, Teressa Lower, NP   cefTRIAXone (ROCEPHIN) 1 g in sodium chloride 0.9 % 100 mL IVPB, 1 g, Intravenous, Q24H, [place], Bing Neighbors, NP, Stopped at 07/17/21 [id number]   Chlorhexidine Gluconate Cloth 2 % PADS 6 each, 6 each, Topical, Q0600, Algernon Huxley, MD, 6 each at 07/16/21 0526   docusate (COLACE) 50 MG/5ML liquid 100 mg, 100 mg, Per Tube, BID, Teressa Lower, NP   fentaNYL (SUBLIMAZE) bolus via infusion 25-100 mcg, 25-100 mcg, Intravenous, Q2H PRN, Rust-Chester, Toribio Harbour L, NP   fentaNYL 2556mcg in NS 247mL (75mcg/ml) infusion-PREMIX, 0-200 mcg/hr, Intravenous, Continuous, Rust-Chester, Huel Cote, NP, Stopped at 07/16/21 2241   gabapentin (NEURONTIN) capsule 300 mg, 300 mg, Oral, QHS, Dew, Erskine Squibb, MD   hydrALAZINE (APRESOLINE) injection 5 mg, 5 mg, Intravenous, Q20 Min PRN, Dew, Erskine Squibb, MD   insulin aspart (novoLOG) injection 0-15 Units, 0-15 Units, Subcutaneous, Q4H, Teressa Lower, NP, 2 Units at 07/17/21 0334   insulin glargine-yfgn (SEMGLEE) injection 10 Units, 10 Units, Subcutaneous, BID, Ottie Glazier, MD, 10 Units at 07/16/21 2306   midazolam (VERSED) injection 1 mg, 1 mg, Intravenous, Q15 min PRN, Teressa Lower, NP   midazolam (VERSED) injection 1-2 mg, 1-2 mg, Intravenous, Q2H PRN, Teressa Lower, NP   norepinephrine (LEVOPHED) 4mg  in 24mL (0.016 mg/mL) premix  infusion, 0-40 mcg/min, Intravenous, Titrated, Teressa Lower, NP, Last Rate: 30 mL/hr at 07/17/21 0336, 8 mcg/min at 07/17/21 0336   ondansetron (ZOFRAN) injection 4 mg, 4 mg, Intravenous, Q6H PRN, Kris Hartmann, NP   ondansetron (ZOFRAN) injection 4 mg, 4 mg, Intravenous, Q6H PRN, Lucky Cowboy, Erskine Squibb, MD   phenylephrine CONCENTRATED 100mg  in sodium chloride 0.9% 244mL (0.4mg /mL) premix infusion, 0-400 mcg/min, Intravenous, Titrated, [place], Bing Neighbors, NP, Last Rate: 4.5 mL/hr at 07/17/21 0900, 30 mcg/min at 07/17/21 0900   polyethylene glycol (MIRALAX / GLYCOLAX) packet 17 g, 17 g, Per Tube, Daily, Teressa Lower, NP   sodium bicarbonate 150 mEq in sterile water 1,150 mL infusion, , Intravenous, Continuous, Teressa Lower, NP, Last Rate: 100 mL/hr at 07/17/21 0900, Infusion Verify at 07/17/21 0900   sodium chloride flush (NS) 0.9 % injection 10-40 mL, 10-40 mL, Intracatheter, Q12H, Rust-Chester, Britton L, NP, 10 mL at 07/17/21 0335   sodium chloride flush (NS) 0.9 % injection 10-40 mL, 10-40 mL, Intracatheter, PRN, Rust-Chester, Britton L, NP   sodium chloride flush (NS) 0.9 % injection 3 mL, 3 mL, Intravenous, Q12H, Dew, Jason S, MD, 3 mL at 07/16/21 1000   sodium chloride flush (NS) 0.9 % injection 3 mL, 3 mL, Intravenous, PRN, Lucky Cowboy, Erskine Squibb, MD   traZODone (DESYREL) tablet 50 mg, 50 mg, Oral,  QHS, Algernon Huxley, MD   vasopressin (PITRESSIN) 20 Units in sodium chloride 0.9 % 100 mL infusion-*FOR SHOCK*, 0-0.03 Units/min, Intravenous, Continuous, Teressa Lower, NP, Stopped at 07/17/21 0709   Exam: Current vital signs: BP (!) 110/55    Pulse (!) 107    Temp (!) 101.3 F (38.5 C)    Resp (!) 23    Ht 5\' 4"  (1.626 m)    Wt 47.6 kg    SpO2 90%    BMI 18.01 kg/m  Vital signs in last 24 hours: Temp:  [94.9 F (34.9 C)-101.3 F (38.5 C)] 101.3 F (38.5 C) (02/11 0900) Pulse Rate:  [76-147] 107 (02/11 0900) Resp:  [5-38] 23 (02/11 0900) BP: (68-149)/(42-124) 110/55 (02/11 0900) SpO2:  [35  %-100 %] 90 % (02/11 0900) FiO2 (%):  [30 %-60 %] 40 % (02/11 0800) General: Intubated, on pressors but no sedation. HEENT: Normocephalic/atraumatic CVS: Regular rate rhythm Respiratory: Vented Abdomen nondistended nontender Extremities: Right groin hematoma noted on inspection. Neurological exam Intubated On pressors No sedation Some spontaneous movement of the left upper extremity. Does not open eyes or follow commands to verbal input. Does not open eyes with noxious stimulation or sternal rub. Cranial nerves: Left pupil is 4 to 5 mm fixed, right pupil 3 mm sluggishly reactive.  Corneal reflexes weak but present bilaterally, she does resist some amount of eye openin.  Facial symmetry difficult to ascertain due to the endotracheal tube.  Breathing over the ventilator at times. Motor examination: Spontaneous left upper extremity movement noted.  To noxious simulation, she does localize some of the left upper extremity.  Right upper extremity is flaccid.  Both lower extremities show no movement with noxious stimulation but on noxious stimulation to bilateral lower extremities there is somewhat of forced eye closure and left upper extremity movement more so when not stimulation is applied to the right leg than the left leg. Sensation: As above Coordination cannot be assessed given her mentation NIHSS Stroke scale - 30   Labs I have reviewed labs in epic and the results pertinent to this consultation are:  CBC    Component Value Date/Time   WBC 15.1 (H) 07/17/2021 0321   RBC 2.34 (L) 07/17/2021 0321   HGB 7.4 (L) 07/17/2021 0321   HCT 21.7 (L) 07/17/2021 0321   PLT 121 (L) 07/17/2021 0321   MCV 92.7 07/17/2021 0321   MCH 31.6 07/17/2021 0321   MCHC 34.1 07/17/2021 0321   RDW 15.0 07/17/2021 0321   LYMPHSABS 1.2 07/17/2021 0321   MONOABS 0.7 07/17/2021 0321   EOSABS 0.0 07/17/2021 0321   BASOSABS 0.0 07/17/2021 0321    CMP     Component Value Date/Time   NA 134 (L)  07/17/2021 0321   K 4.1 07/17/2021 0321   CL 99 07/17/2021 0321   CO2 26 07/17/2021 0321   GLUCOSE 156 (H) 07/17/2021 0321   BUN 29 (H) 07/17/2021 0321   CREATININE 1.21 (H) 07/17/2021 0321   CALCIUM 7.0 (L) 07/17/2021 0321   PROT <3.0 (L) 07/16/2021 1530   ALBUMIN 1.7 (L) 07/16/2021 1530   AST 459 (H) 07/16/2021 1530   ALT 409 (H) 07/16/2021 1530   ALKPHOS 50 07/16/2021 1530   BILITOT 0.4 07/16/2021 1530   GFRNONAA 48 (L) 07/17/2021 0321   GFRAA >60 05/12/2016 1816    Lipid Panel     Component Value Date/Time   CHOL 34 07/17/2021 0321   TRIG 49 07/17/2021 0321   HDL 16 (L) 07/17/2021 0321  CHOLHDL 2.1 07/17/2021 0321   VLDL 10 07/17/2021 0321   LDLCALC 8 07/17/2021 0321   Imaging I have reviewed the images obtained:  CT-head IMPRESSION: 1. Negative for intracranial hemorrhage. 2. Abnormal low attenuation involving the superior cerebellum in addition to abnormal low-density within the pons and patchy low density in the brainstem and left thalamus. Findings are suspicious for acute infarct. No hydrocephalus at this time. Correlation with CT angiography could be obtained to assess for basilar thrombosis though no hyperdense vessel seen on this non contrasted head CT.  CT angiography head and neck-basilar patent proximally but partially occlusive thrombus at the basilar tip with bilateral PCA occlusion at the origin but patent distally.  SCAs patent. IMPRESSION: 1. 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 2. 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 3. 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 4. 3 mm left paraophthalmic aneurysm. 5. Multifocal pneumonia with layering bilateral pleural effusions, partially visualized.  Assessment and recommendations:  71 year old with past history of peripheral arterial disease with extensive bilateral lower  extremity revascularizations, diabetes, who was in the hospital for elective left lower extremity angiography with complications of groin hematoma and spread to involve abdominal wall hematoma with a cardiac arrest requiring CPR followed by which noted to have anisocoria.  CT head concerning for posterior circulation strokes involving the pons, cerebellum and thalami. CT angiography of the head with a top of the basilar thrombus.  Emergently evaluated by the telemedicine neurology team and discussed with neuro interventional radiology-not a candidate for intervention due to extensive infarct seen on CT as well as poor baseline.  Not a candidate for anticoagulation given the large size of the stroke as well as the groin and abdominal wall hematoma. An MRI of the brain will be obtained but I do not anticipate her being able to survive this. I have relayed my initial thoughts on this case to the CCM team as well as and the 2 granddaughters at bedside. Stroke work-up can be completed if desired but I am not sure that that is going to change her outcome. I have ordered the MRI of the brain without contrast and will need to the family again for further discussions, goals of care conversation and answering any other questions that they might have. The only other addition from the telemedicine neurology consultation that I can add is to start her on hyperosmolar therapy.  We can give her a 3% bolus followed by 3% saline running peripherally at 75 cc an hour to keep her sodium between 150-155. We will follow   Addendum MRI brain completed-strokes in bilateral ACA territories, left thalamus, left midbrain, and scattered embolic strokes in bilateral anterior and posterior vascular territories. 2D echo with global hypokinesis, mid apical anteroseptal mid apical inferior septal mid apical anterior, apical and apical lateral akinesis.  Small apical thrombus.  EF 20 to 25%.  Left atrium mildly dilated.  Large pleural  effusion in the left lateral margin.  Mitral valve normal.  Aortic valve normal.  After the MRI findings, a family meeting was held.  To my surprise there were over 25 family members in the [place] area.  I politely requested that only the next of kin-children be there.  She does not have a living husband.  There were at least 10-12 children and grandchildren in the room after that.  I showed them the MRI pictures, the CT  angiography pictures and explained in detail with the help of an in person interpreter what was going on with the patient. I gave him the opportunity to ask questions which were plenty.  I tried to answer all of them to the best of my ability. At this time, she remains critical, she still is at high risk for cerebral edema for which she has been started on hypertonic saline. Due to the LV thrombus, she should be anticoagulated but given her large groin and abdominal wall hematoma, I would defer that to vascular surgery.  Cardiology is in agreement that IV heparin should be initiated given the apical thrombus and global hypokinesis/akinesis seen on 2D echocardiogram.   Family was very concerned about the patient becoming sick right after surgery and asked if this is because of the surgery.  To my knowledge, she was talking and communicative after the surgery and the pupillary asymmetry was noted sometime at night based on the note from 10:10 PM yesterday.  I explained as much as I could through chart review to the family.   I have discussed my plan in detail with Dr. Lanney Gins and Dr. Shelia Media via secure chat.  CRITICAL CARE ATTESTATION Performed by: Amie Portland, MD Total critical care time: 110 minutes Critical care time was exclusive of separately billable procedures and treating other patients and/or supervising APPs/Residents/Students Critical care was necessary to treat or prevent imminent or life-threatening deterioration due to basilar occlusion, cerebellar infarction,  midbrain infarction, thalamic infarcts, posterior circulation strokes, cerebral edema. This patient is critically ill and at significant risk for neurological worsening and/or death and care requires constant monitoring. Critical care was time spent personally by me on the following activities: development of treatment plan with patient and/or surrogate as well as nursing, discussions with consultants, evaluation of patient's response to treatment, examination of patient, obtaining history from patient or surrogate, ordering and performing treatments and interventions, ordering and review of laboratory studies, ordering and review of radiographic studies, pulse oximetry, re-evaluation of patient's condition, participation in multidisciplinary rounds and medical decision making of high complexity in the care of this patient.  -- Amie Portland, MD Neurologist [facility] Pager: [phone]

## 2021-07-17 NOTE — Progress Notes (Signed)
*  PRELIMINARY RESULTS* Echocardiogram 2D Echocardiogram has been performed. Definity IV Contrast used on this study.  Lenor Coffin 07/17/2021, 10:44 AM

## 2021-07-18 LAB — GLUCOSE, CAPILLARY
Glucose-Capillary: 88 mg/dL (ref 70–99)
Glucose-Capillary: 103 mg/dL — ABNORMAL HIGH (ref 70–99)

## 2021-07-18 LAB — BPAM RBC
Blood Product Expiration Date: 202303022359
Blood Product Expiration Date: 202303082359
Blood Product Expiration Date: 202303132359
Blood Product Expiration Date: 202303142359
Blood Product Expiration Date: 202303142359
Blood Product Expiration Date: 202303142359
Blood Product Expiration Date: 202303142359
Blood Product Expiration Date: 202303142359
ISSUE DATE / TIME: 202302092044
ISSUE DATE / TIME: 202302100515
ISSUE DATE / TIME: 202302101526
ISSUE DATE / TIME: 202302110438
ISSUE DATE / TIME: 202302120004
ISSUE DATE / TIME: 202302120917
Unit Type and Rh: 5100
Unit Type and Rh: 5100
Unit Type and Rh: 5100
Unit Type and Rh: 5100
Unit Type and Rh: 5100
Unit Type and Rh: 5100
Unit Type and Rh: 5100
Unit Type and Rh: 5100

## 2021-07-18 LAB — HEPARIN LEVEL (UNFRACTIONATED)
Heparin Unfractionated: <0.1 IU/mL — ABNORMAL LOW (ref 0.30–0.70)
Heparin Unfractionated: 0.2 IU/mL — ABNORMAL LOW (ref 0.30–0.70)

## 2021-07-18 LAB — TYPE AND SCREEN
ABO/RH(D): O POS
Antibody Screen: NEGATIVE
Unit division: 0
Unit division: 0
Unit division: 0
Unit division: 0
Unit division: 0
Unit division: 0
Unit division: 0
Unit division: 0

## 2021-07-18 LAB — CBC
HCT: 27.2 % — ABNORMAL LOW (ref 36.0–46.0)
Hemoglobin: 9 g/dL — ABNORMAL LOW (ref 12.0–15.0)
MCH: 32.1 pg (ref 26.0–34.0)
MCHC: 33.1 g/dL (ref 30.0–36.0)
MCV: 97.1 fL (ref 80.0–100.0)
Platelets: 92 10*3/uL — ABNORMAL LOW (ref 150–400)
RBC: 2.8 MIL/uL — ABNORMAL LOW (ref 3.87–5.11)
RDW: 15.4 % (ref 11.5–15.5)
WBC: 8.8 10*3/uL (ref 4.0–10.5)
nRBC: 0.2 % (ref 0.0–0.2)

## 2021-07-18 LAB — COMPREHENSIVE METABOLIC PANEL
ALT: 329 U/L — ABNORMAL HIGH (ref 0–44)
AST: 1086 U/L — ABNORMAL HIGH (ref 15–41)
Albumin: 2.9 g/dL — ABNORMAL LOW (ref 3.5–5.0)
Alkaline Phosphatase: 90 U/L (ref 38–126)
Anion gap: 12 (ref 5–15)
BUN: 44 mg/dL — ABNORMAL HIGH (ref 8–23)
CO2: 26 mmol/L (ref 22–32)
Calcium: 6.3 mg/dL — CL (ref 8.9–10.3)
Chloride: 110 mmol/L (ref 98–111)
Creatinine, Ser: 2.36 mg/dL — ABNORMAL HIGH (ref 0.44–1.00)
GFR, Estimated: 21 mL/min — ABNORMAL LOW (ref >60)
Glucose, Bld: 109 mg/dL — ABNORMAL HIGH (ref 70–99)
Potassium: 4.5 mmol/L (ref 3.5–5.1)
Sodium: 148 mmol/L — ABNORMAL HIGH (ref 135–145)
Total Bilirubin: 1 mg/dL (ref 0.3–1.2)
Total Protein: 3.9 g/dL — ABNORMAL LOW (ref 6.5–8.1)

## 2021-07-18 LAB — MAGNESIUM: Magnesium: 2 mg/dL (ref 1.7–2.4)

## 2021-07-18 LAB — PHOSPHORUS: Phosphorus: 5.3 mg/dL — ABNORMAL HIGH (ref 2.5–4.6)

## 2021-07-18 LAB — SODIUM: Sodium: 155 mmol/L — ABNORMAL HIGH (ref 135–145)

## 2021-07-18 LAB — HEMOGLOBIN AND HEMATOCRIT, BLOOD
HCT: 26.3 % — ABNORMAL LOW (ref 36.0–46.0)
Hemoglobin: 8.7 g/dL — ABNORMAL LOW (ref 12.0–15.0)

## 2021-07-18 LAB — LACTIC ACID, PLASMA: Lactic Acid, Venous: 5.2 mmol/L (ref 0.5–1.9)

## 2021-07-18 MED ORDER — ORAL CARE MOUTH RINSE
15.0000 mL | OROMUCOSAL | Status: DC
  Administered 2021-07-18 (×3): 15 mL via OROMUCOSAL

## 2021-07-18 MED ORDER — ACETAMINOPHEN 500 MG PO TABS: 1000.0000 mg | ORAL_TABLET | Freq: Once | ORAL | Status: DC

## 2021-07-18 MED ORDER — ACETAMINOPHEN 325 MG PO TABS
325.0000 mg | ORAL_TABLET | Freq: Once | ORAL | Status: AC
  Administered 2021-07-18: 325 mg

## 2021-07-18 MED ORDER — CALCIUM GLUCONATE-NACL 2-0.675 GM/100ML-% IV SOLN
2.0000 g | Freq: Once | INTRAVENOUS | Status: AC
  Administered 2021-07-18: 2000 mg via INTRAVENOUS
  Filled 2021-07-18: qty 100

## 2021-07-18 MED ORDER — IBUPROFEN 100 MG/5ML PO SUSP
600.0000 mg | Freq: Once | ORAL | Status: DC
  Filled 2021-07-18: qty 30

## 2021-07-18 MED ORDER — MORPHINE 100MG IN NS 100ML (1MG/ML) PREMIX INFUSION
10.0000 mg/h | INTRAVENOUS | Status: DC
  Administered 2021-07-18: 10 mg/h via INTRAVENOUS
  Filled 2021-07-18: qty 100

## 2021-07-18 MED ORDER — CHLORHEXIDINE GLUCONATE 0.12% ORAL RINSE (MEDLINE KIT)
15.0000 mL | Freq: Two times a day (BID) | OROMUCOSAL | Status: DC
  Administered 2021-07-18: 15 mL via OROMUCOSAL

## 2021-07-19 ENCOUNTER — Encounter (INDEPENDENT_AMBULATORY_CARE_PROVIDER_SITE_OTHER): Payer: Medicare Other

## 2021-07-19 ENCOUNTER — Ambulatory Visit: Admission: RE | Admit: 2021-07-19 | Payer: Medicare Other | Source: Home / Self Care | Admitting: Vascular Surgery

## 2021-07-19 ENCOUNTER — Ambulatory Visit (INDEPENDENT_AMBULATORY_CARE_PROVIDER_SITE_OTHER): Payer: Medicare Other | Admitting: Nurse Practitioner

## 2021-07-19 DIAGNOSIS — I70229 Atherosclerosis of native arteries of extremities with rest pain, unspecified extremity: Secondary | ICD-10-CM

## 2021-07-19 LAB — URINE CULTURE: Culture: 20000 — AB

## 2021-07-19 SURGERY — LOWER EXTREMITY ANGIOGRAPHY
Anesthesia: Moderate Sedation | Site: Leg Lower | Laterality: Right

## 2021-07-21 LAB — BPAM FFP
Blood Product Expiration Date: 202302142359
Blood Product Expiration Date: 202302142359
Blood Product Expiration Date: 202302142359
Blood Product Expiration Date: 202302142359
ISSUE DATE / TIME: 202302130930
ISSUE DATE / TIME: 202302131031
Unit Type and Rh: 6200
Unit Type and Rh: 6200
Unit Type and Rh: 8400
Unit Type and Rh: 8400

## 2021-07-21 LAB — PREPARE FRESH FROZEN PLASMA: Unit division: 0

## 2021-07-21 LAB — BPAM PLATELET PHERESIS
Blood Product Expiration Date: 202302092359
Blood Product Expiration Date: 202302112359
Unit Type and Rh: 5100
Unit Type and Rh: 600

## 2021-07-21 LAB — PREPARE PLATELET PHERESIS
Unit division: 0
Unit division: 0

## 2021-07-22 LAB — BLOOD GAS, ARTERIAL
Acid-base deficit: 22.9 mmol/L — ABNORMAL HIGH (ref 0.0–2.0)
Bicarbonate: 7.8 mmol/L — ABNORMAL LOW (ref 20.0–28.0)
FIO2: 60 %
MECHVT: 400 mL
O2 Saturation: 24.8 %
PEEP: 5 cmH2O
Patient temperature: 37
RATE: 20 resp/min
pCO2 arterial: 40 mmHg (ref 32.0–48.0)
pH, Arterial: 6.9 — CL (ref 7.350–7.450)
pO2, Arterial: 32 mmHg — CL (ref 83.0–108.0)

## 2021-07-26 LAB — BLOOD GAS, ARTERIAL
Acid-Base Excess: 0.4 mmol/L (ref 0.0–2.0)
Bicarbonate: 26 mmol/L (ref 20.0–28.0)
FIO2: 0.4 %
MECHVT: 400 mL
Mechanical Rate: 20
O2 Saturation: 44.7 %
PEEP: 5 cmH2O
Patient temperature: 37
pCO2 arterial: 46 mmHg (ref 32.0–48.0)
pH, Arterial: 7.36 (ref 7.350–7.450)
pO2, Arterial: <31 mmHg — CL (ref 83.0–108.0)

## 2021-08-03 ENCOUNTER — Ambulatory Visit (INDEPENDENT_AMBULATORY_CARE_PROVIDER_SITE_OTHER): Payer: Medicare Other | Admitting: Nurse Practitioner

## 2021-08-03 ENCOUNTER — Encounter (INDEPENDENT_AMBULATORY_CARE_PROVIDER_SITE_OTHER): Payer: Medicare Other

## 2021-08-04 NOTE — Death Summary Note (Signed)
DEATH SUMMARY   Patient Details  Name: Carrie Hernandez MRN: [id number] DOB: 1950-06-24  Admission/Discharge Information   Admit Date:  07-17-21  Date of Death: Date of Death: 2021/07/20  Time of Death: Time of Death: 1531-09-24  Length of Stay: 3  Referring Physician: [facility]   Reason(s) for Hospitalization  Ischemic limb  Diagnoses  Preliminary cause of death:  Secondary Diagnoses (including complications and co-morbidities):  Principal Problem:   Ischemic leg Active Problems:   Atherosclerosis of artery of extremity with rest pain [facility])   Cardiac arrest [facility])   Brief Hospital Course (including significant findings, care, treatment, and services provided and events leading to death)  Carrie Hernandez is a 71 y.o. year old female who presented to the hospital with acute on chronic ischemia of the lower extremities with markedly reduced ABIs with both rest pain and ulceration with clearly threatened limbs.    Pertinent Labs and Studies  Significant Diagnostic Studies CT ABDOMEN PELVIS WO CONTRAST  Result Date: July 17, 2021 CLINICAL DATA:  Possible retroperitoneal bleed EXAM: CT ABDOMEN AND PELVIS WITHOUT CONTRAST TECHNIQUE: Multidetector CT imaging of the abdomen and pelvis was performed following the standard protocol without IV contrast. RADIATION DOSE REDUCTION: This exam was performed according to the departmental dose-optimization program which includes automated exposure control, adjustment of the mA and/or kV according to patient size and/or use of iterative reconstruction technique. COMPARISON:  Chest x-ray 2021/07/17 FINDINGS: Lower chest: Lung bases demonstrate no acute consolidation or pleural effusion. Normal cardiac size. Coronary vascular calcification. Hepatobiliary: Hyperdensity in the gallbladder likely reflects vicarious excretion of contrast. No biliary dilatation Pancreas: Unremarkable. No pancreatic ductal dilatation or surrounding inflammatory changes.  Spleen: Normal in size without focal abnormality. Adrenals/Urinary Tract: Adrenal glands are normal. Kidneys show no hydronephrosis. Excreted contrast in the renal collecting systems and bladder. Bladder decompressed by Foley catheter Stomach/Bowel: Moderate fluid distension of the stomach. No dilated small bowel. No acute bowel wall thickening. Vascular/Lymphatic: Advanced aortic atherosclerosis. No aneurysm. Stents within the bilateral superficial femoral arteries incompletely visualized. Lobulated hyperdense collection in the right groin measuring 5.1 by 2.7 by 8.5 cm consistent with hematoma. Extensive stranding and hyperdense fluid within the subcutaneous soft tissues anterior to the right hip, this extends over the right pubic area and into the right thigh but the inferior extent is incompletely visualized. Large hyperdense fluid collection/hematoma within the subcutaneous soft tissues, mostly superficial to the right rectus and lower quadrant abdominal wall. This extends to the right flank region. This measures approximately 14.2 cm transverse by 5.5 cm AP by 20.1 cm oblique craniocaudad. The collection is difficult to separate from the underlying right rectus and a component of rectus sheath hematoma is possible. Reproductive: Uterus unremarkable.  No adnexal mass. Other: Negative for pelvic effusion or free air. Musculoskeletal: No acute osseous abnormality. IMPRESSION: 1. Large hyperdense collection/hematoma primarily within the subcutaneous soft tissues superficial to the right lower quadrant abdominal wall and rectus, this extends to the right flank region above the level of umbilicus and inferiorly to the proximal thigh with the inferior extent incompletely visualized. There is a lobulated focal hyperdense collection/hematoma in the right groin anterior to the common femoral vessels consistent with groin hematoma. Correlation with sonography could be obtained to assess for pseudoaneurysm. 2. Critical  Value/emergent results were called by telephone at the time of interpretation on 2021-07-17 at 10:21 pm to provider Webb Silversmith , who verbally acknowledged these results. Electronically Signed   By: Jasmine Pang M.D.   On:  08-14-21 22:22   CT HEAD WO CONTRAST ( )  Result Date: 07/16/2021 CLINICAL DATA:  Altered mental status EXAM: CT HEAD WITHOUT CONTRAST TECHNIQUE: Contiguous axial images were obtained from the base of the skull through the vertex without intravenous contrast. RADIATION DOSE REDUCTION: This exam was performed according to the departmental dose-optimization program which includes automated exposure control, adjustment of the mA and/or kV according to patient size and/or use of iterative reconstruction technique. COMPARISON:  None. FINDINGS: Brain: No hemorrhage or intracranial mass is visualized. Focal hypodensity within the left thalamus. Patchy hypodensity within the pons and brainstem with diffuse hypodensity within the superior cerebellar hemispheres. Fourth ventricle patent. Some effacement of quadrigeminal plate cistern. Lateral and third ventricles are nondilated. Vascular: No hyperdense vessels.  Carotid vascular calcification Skull: Normal. Negative for fracture or focal lesion. Sinuses/Orbits: Fluid levels in the maxillary sinuses. Moderate mucosal thickening in the ethmoid sinuses Other: None IMPRESSION: 1. Negative for intracranial hemorrhage. 2. Abnormal low attenuation involving the superior cerebellum in addition to abnormal low-density within the pons and patchy low density in the brainstem and left thalamus. Findings are suspicious for acute infarct. No hydrocephalus at this time. Correlation with CT angiography could be obtained to assess for basilar thrombosis though no hyperdense vessel seen on this non contrasted head CT. Electronically Signed   By: Jasmine Pang M.D.   On: 07/16/2021 22:15   CT Angio Chest Pulmonary Embolism (PE) W or WO Contrast  Result Date:  07/16/2021 CLINICAL DATA:  Shortness of breath, altered mental status, evaluate for PE EXAM: CT ANGIOGRAPHY CHEST WITH CONTRAST TECHNIQUE: Multidetector CT imaging of the chest was performed using the standard protocol during bolus administration of intravenous contrast. Multiplanar CT image reconstructions and MIPs were obtained to evaluate the vascular anatomy. RADIATION DOSE REDUCTION: This exam was performed according to the departmental dose-optimization program which includes automated exposure control, adjustment of the mA and/or kV according to patient size and/or use of iterative reconstruction technique. CONTRAST:  OMNIPAQUE IOHEXOL 350 MG/ML SOLN COMPARISON:  Chest radiograph dated 07/16/2021 FINDINGS: Cardiovascular: Satisfactory opacification of the bilateral pulmonary arteries to the lobar level. No evidence of pulmonary embolism. No evidence of thoracic aortic aneurysm. Mild atherosclerotic calcifications. The heart is normal in size.  No pericardial effusion. Three vessel coronary atherosclerosis. Right IJ venous catheter terminates at the cavoatrial junction. Mediastinum/Nodes: Small mediastinal lymph nodes which do not meet pathologic CT size criteria. Visualized thyroid is unremarkable. Lungs/Pleura: Multifocal patchy opacities in the posterior upper and lower lobes, left lower lobe predominant, suspicious for multifocal pneumonia, possibly on the basis of aspiration. Small bilateral pleural effusions with associated mild compressive atelectasis in the bilateral lung bases. Mild biapical pleural-parenchymal scarring. No suspicious pulmonary nodules. No pneumothorax. Upper Abdomen: Evaluated on dedicated CT. Musculoskeletal: No focal osseous lesions. Review of the MIP images confirms the above findings. IMPRESSION: No evidence of pulmonary embolism. Multifocal pneumonia, left lower lobe predominant, possibly on the basis of aspiration. Small bilateral pleural effusions. Aortic Atherosclerosis  (ICD10-I70.0). Electronically Signed   By: Charline Bills M.D.   On: 07/16/2021 22:12   MR BRAIN WO CONTRAST  Result Date: 07/17/2021 CLINICAL DATA:  Stroke, follow-up. Cardiac arrest. Basilar tip thrombosis. EXAM: MRI HEAD WITHOUT CONTRAST TECHNIQUE: Multiplanar, multiecho pulse sequences of the brain and surrounding structures were obtained without intravenous contrast. COMPARISON:  Head CT 07/16/2021, head and neck CTA 07/17/2021, and head MRI 04/02/2020 FINDINGS: Brain: As seen on CT, there are large acute infarcts involving the superior aspects of both cerebellar  hemispheres. Acute infarcts are also present in the pons, left greater than right midbrain, and left thalamus. Additional punctate acute cortical and subcortical infarcts are scattered throughout both cerebral hemispheres, greatest posteriorly though with minor bilateral frontal lobe involvement as well. There is associated bilateral cerebellar edema and petechial hemorrhage with partial effacement of the basilar cisterns and fourth ventricle. The lateral and third ventricles are slightly more prominent than on yesterday CT. There is a small chronic infarct with chronic blood products in the medial left occipital lobe. Mild cerebral atrophy is within normal limits for age. No mass, midline shift, or extra-axial fluid collection is identified. Vascular: Major intracranial vascular flow voids are preserved. Skull and upper cervical spine: Unremarkable bone marrow signal para Sinuses/Orbits: Unremarkable orbits. Subtotal opacification of the right maxillary sinus by fluid. Mild mucosal thickening in the other paranasal sinuses. Small right mastoid effusion. Other: None. IMPRESSION: 1. Large acute bilateral cerebellar infarcts with edema and petechial hemorrhage. Partial effacement of the basilar cisterns and fourth ventricle with possible early hydrocephalus. 2. Additional acute infarcts in the brainstem, left thalamus, and scattered peripherally  throughout both cerebral hemispheres. Electronically Signed   By: Sebastian Ache M.D.   On: 07/17/2021 17:03   CT ABDOMEN PELVIS W CONTRAST  Result Date: 07/16/2021 CLINICAL DATA:  Follow-up retroperitoneal hematoma. EXAM: CT ABDOMEN AND PELVIS WITH CONTRAST TECHNIQUE: Multidetector CT imaging of the abdomen and pelvis was performed using the standard protocol following bolus administration of intravenous contrast. RADIATION DOSE REDUCTION: This exam was performed according to the departmental dose-optimization program which includes automated exposure control, adjustment of the mA and/or kV according to patient size and/or use of iterative reconstruction technique. CONTRAST:  OMNIPAQUE IOHEXOL 350 MG/ML SOLN COMPARISON:  CT abdomen pelvis dated 07/12/2021. FINDINGS: Lower chest: Partially visualized small bilateral pleural effusions with partial compressive atelectasis of the lower lobes versus pneumonia. No intra-abdominal free air.  Small free fluid in the pelvis. Hepatobiliary: The liver is unremarkable. No intrahepatic biliary dilatation. High attenuating content within the gallbladder may represent vicarious excretion of the contrast or sludge versus small stones. Pancreas: Unremarkable. No pancreatic ductal dilatation or surrounding inflammatory changes. Spleen: The spleen is somewhat small. Adrenals/Urinary Tract: The adrenal glands unremarkable. The kidneys, and the visualized ureters are unremarkable. The urinary bladder is decompressed around a Foley catheter. Stomach/Bowel: Enteric tube is noted with tip in the body of the stomach. There is no bowel obstruction or active inflammation. The appendix is normal. Vascular/Lymphatic: Advanced aortoiliac atherosclerotic disease. Vascular stents noted in the proximal superficial femoral arteries which are not well evaluated but appear occluded. The IVC is unremarkable. No portal venous gas. There is no adenopathy. Reproductive: The uterus is grossly  unremarkable. Other: A compression device noted over the right groin. Significant decrease in the right anterior abdominal/pelvic wall intramuscular hematoma compared to prior CT as well as decrease in the size of the right groin hematoma. Musculoskeletal: Osteopenia with degenerative changes of the spine. No acute osseous pathology. IMPRESSION: 1. Significant decrease in the right anterior abdominal/pelvic wall intramuscular hematoma compared to prior CT as well as decrease in the size of the right groin hematoma. 2. No bowel obstruction. Normal appendix. 3. Partially visualized small bilateral pleural effusions with partial compressive atelectasis of the lower lobes versus pneumonia. 4. Aortic Atherosclerosis (ICD10-I70.0). Electronically Signed   By: Elgie Collard M.D.   On: 07/16/2021 22:10   PERIPHERAL VASCULAR CATHETERIZATION  Result Date: Jul 20, 2021 See surgical note for result.  DG Chest [facility] [address]  Result Date: 07/17/2021 CLINICAL DATA:  HYPOXIA, HISTORY OF DM EXAM: PORTABLE CHEST 1 VIEW COMPARISON:  None. FINDINGS: Endotracheal tube terminates 1.5-2 cm above the carina. Enteric tube coursing below the hemidiaphragm with tip and side port collimated off view. Right internal jugular central venous catheter with tip overlying the right atrium. The heart and mediastinal contours are unchanged. Aortic calcification. Interval worsening of perihilar interstitial and airspace opacities in a bat-wing-like configuration. At least bilateral trace to small volume pleural effusion. No pneumothorax. No acute osseous abnormality. IMPRESSION: 1. Interval development of pulmonary edema.  ARDS not excluded. 2. At least bilateral trace to small volume pleural effusions. 3. Endotracheal tube terminates 1.5-2 cm above the carina. 4. Enteric tube coursing below the hemidiaphragm with tip and side port collimated off view. 5.  Aortic Atherosclerosis (ICD10-I70.0). Electronically Signed   By: Tish Frederickson M.D.   On:  07/17/2021 22:31   DG Chest Port 1 View  Result Date: 07/16/2021 CLINICAL DATA:  Chest pain EXAM: PORTABLE CHEST 1 VIEW COMPARISON:  May 14, 2022 FINDINGS: Numerous radiopaque external lines overlie the chest. Defibrillator leads overlie the left hemithorax. Right IJ CVC with tip overlying the superior cavoatrial junction. Endotracheal tube with tip in the lower thoracic trachea. Enteric tube courses below the diaphragm with side port overlying the stomach and tip obscured by collimation. The heart size and mediastinal contours are within normal limits. Slightly reduced lung volumes with vascular crowding and hazy bilateral airspace opacities favored to reflect dependent atelectasis. No visible pleural effusion or pneumothorax. The visualized skeletal structures are unremarkable. IMPRESSION: 1. Slightly reduced lung volumes with vascular crowding and hazy bilateral airspace opacities favored to reflect dependent atelectasis. 2. Support lines and tubes as above. Electronically Signed   By: Maudry Mayhew M.D.   On: 07/16/2021 14:58   DG Chest Port 1 View  Result Date: 13-Aug-2021 CLINICAL DATA:  Central line placement EXAM: PORTABLE CHEST 1 VIEW COMPARISON:  None. FINDINGS: Right IJ central venous catheter tip over the cavoatrial region. No visible pneumothorax. No consolidation or effusion. Normal cardiac size. IMPRESSION: Right IJ central venous catheter tip over the cavoatrial region. No pneumothorax Electronically Signed   By: Jasmine Pang M.D.   On: 2021-08-13 21:35   VAS [place] ABI WITH/WO TBI  Result Date: 07/19/2021  LOWER EXTREMITY DOPPLER STUDY Patient Name:  Carrie Hernandez  Date of Exam:   07/13/2021 Medical Rec #: [id number]          Accession #:    [id number] Date of Birth: 06-01-1951         Patient Gender: F Patient Age:   25 years Exam Location:  [facility] Procedure:      VAS [place] ABI WITH/WO TBI Referring Phys:  --------------------------------------------------------------------------------  Indications: Claudication, rest pain, peripheral artery disease, and Decreased              pulse.  Vascular Interventions: 01/11/2021 PTA of Rt PTA, tibioperonel trunk, proximal &                         distal SFA and above kee popliteal artery. Stent Rt                         proximal and distal SFA. Performing Technologist: Debbe Bales RVS  Examination Guidelines: A complete evaluation includes at minimum, Doppler waveform signals and systolic blood pressure reading at the level of bilateral brachial, anterior tibial,  and posterior tibial arteries, when vessel segments are accessible. Bilateral testing is considered an integral part of a complete examination. Photoelectric Plethysmograph (PPG) waveforms and toe systolic pressure readings are included as required and additional duplex testing as needed. Limited examinations for reoccurring indications may be performed as noted.  ABI Findings: +---------+------------------+-----+----------+--------+  Right     Rt Pressure (mmHg) Index Waveform   Comment   +---------+------------------+-----+----------+--------+  Brachial  125                                           +---------+------------------+-----+----------+--------+  ATA       49                 0.38  monophasic           +---------+------------------+-----+----------+--------+  PTA                                absent               +---------+------------------+-----+----------+--------+  PERO      42                 0.33  monophasic           +---------+------------------+-----+----------+--------+  Great Toe                          Absent               +---------+------------------+-----+----------+--------+ +---------+------------------+-----+-------------------+-------+  Left      Lt Pressure (mmHg) Index Waveform            Comment  +---------+------------------+-----+-------------------+-------+  Brachial  128                                                    +---------+------------------+-----+-------------------+-------+  ATA       36                 0.28  dampened monophasic          +---------+------------------+-----+-------------------+-------+  PTA       47                 0.37  monophasic                   +---------+------------------+-----+-------------------+-------+  Great Toe                          Absent                       +---------+------------------+-----+-------------------+-------+ +-------+-----------+-----------+------------+------------+  ABI/TBI Today's ABI Today's TBI Previous ABI Previous TBI  +-------+-----------+-----------+------------+------------+  Right   .38         absent      1.19         .29           +-------+-----------+-----------+------------+------------+  Left    .37         absent      1.14         .74           +-------+-----------+-----------+------------+------------+  Bilateral ABIs appear decreased compared to prior study on 05/05/2021.  Summary:  *See table(s) above for measurements and observations.  Electronically signed by Festus Barren MD on 07/19/2021 at 10:22:26 AM.    Final    ECHOCARDIOGRAM COMPLETE  Result Date: 07/17/2021    ECHOCARDIOGRAM REPORT   Patient Name:   Carrie Hernandez Date of Exam: 07/17/2021 Medical Rec #:  [id number]         Height:       64.0 in Accession #:    [id number]        Weight:       104.9 lb Date of Birth:  1950-08-19        BSA:          1.487 m Patient Age:    70 years          BP:           121/61 mmHg Patient Gender: F                 HR:           109 bpm. Exam Location:  [facility] Procedure: 2D Echo and Intracardiac Opacification Agent Indications:     CHF I50.21  History:         Patient has no prior history of Echocardiogram examinations.  Sonographer:     Overton Mam RDCS Referring Phys:  [id number] Raymon Mutton DUNN Diagnosing Phys: Chilton Si MD  Sonographer Comments: Echo performed with patient supine and on artificial respirator.  Image acquisition challenging due to respiratory motion. IMPRESSIONS  1. Global hypokinesis with mid-apical anteroseptal, mid-apical inferoseptal, mid-apical anterior, apical, and apical lateral akinesis. Relative sparing of the basal segments. 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 Small apical thrombus (1 cm x 0.14 cm). Left ventricular ejection fraction, by estimation, is 20 to 25%. The left ventricle has severely decreased function. The left ventricle demonstrates global hypokinesis. The left ventricular internal cavity  size was mildly dilated. Left ventricular diastolic parameters are consistent with Grade II diastolic dysfunction (pseudonormalization). Elevated left ventricular end-diastolic pressure.  2. Right ventricular systolic function is normal. The right ventricular size is normal. There is moderately elevated pulmonary artery systolic pressure.  3. Left atrial size was mildly dilated.  4. Large pleural effusion in the left lateral region.  5. The mitral valve is normal in structure. Mild to moderate mitral valve regurgitation. No evidence of mitral stenosis.  6. The aortic valve is normal in structure. Aortic valve regurgitation is not visualized. No aortic stenosis is present. FINDINGS  Left Ventricle: Global hypokinesis with mid-apical anteroseptal, mid-apical inferoseptal, mid-apical anterior, apical, and apical lateral akinesis. Relative sparing of the basal segments. 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 Small apical thrombus (1 cm x 0.14 cm). Left ventricular ejection fraction, by estimation, is 20 to 25%. The left ventricle has severely decreased function. The left ventricle demonstrates global hypokinesis. Definity contrast agent was given IV to delineate the left ventricular endocardial borders. The left ventricular internal cavity size was mildly dilated. There is no left ventricular hypertrophy. Left  ventricular diastolic parameters are consistent with Grade II diastolic dysfunction (pseudonormalization). Elevated left ventricular end-diastolic pressure. Right Ventricle: The right ventricular size is normal. No increase in right ventricular wall thickness. Right ventricular systolic function is normal. There is moderately elevated pulmonary artery systolic pressure. The tricuspid regurgitant velocity is 3.30 m/s, and with an assumed right atrial pressure of 15 mmHg, the estimated right ventricular systolic  pressure is 58.6 mmHg. Left Atrium: Left atrial size was mildly dilated. Right Atrium: Right atrial size was normal in size. Pericardium: There is no evidence of pericardial effusion. Mitral Valve: The mitral valve is normal in structure. There is mild thickening of the mitral valve leaflet(s). There is mild calcification of the mitral valve leaflet(s). Mild mitral annular calcification. Mild to moderate mitral valve regurgitation. No  evidence of mitral valve stenosis. Tricuspid Valve: The tricuspid valve is normal in structure. Tricuspid valve regurgitation is mild . No evidence of tricuspid stenosis. Aortic Valve: The aortic valve is normal in structure. Aortic valve regurgitation is not visualized. No aortic stenosis is present. Aortic valve peak gradient measures 8.3 mmHg. Pulmonic Valve: The pulmonic valve was normal in structure. Pulmonic valve regurgitation is trivial. No evidence of pulmonic stenosis. Aorta: The aortic root is normal in size and structure. Venous: IVC dilated 2.35 cm. IVC assessment for right atrial pressure unable to be performed due to mechanical ventilation. IAS/Shunts: No atrial level shunt detected by color flow Doppler. Additional Comments: There is a large pleural effusion in the left lateral region.  LEFT VENTRICLE PLAX 2D LVIDd:         5.10 cm      Diastology LVIDs:         4.34 cm      LV e' medial:    4.03 cm/s LV PW:         0.82 cm      LV E/e' medial:  29.3 LV IVS:         0.88 cm      LV e' lateral:   4.57 cm/s LVOT diam:     1.80 cm      LV E/e' lateral: 25.8 LV SV:         29 LV SV Index:   20 LVOT Area:     2.54 cm                              3D Volume EF: LV Volumes (MOD)            3D EF:        35 % LV vol d, MOD A2C: 132.0 ml LV EDV:       104 ml LV vol d, MOD A4C: 108.5 ml LV ESV:       67 ml LV vol s, MOD A2C: 92.5 ml  LV SV:        37 ml LV vol s, MOD A4C: 86.6 ml LV SV MOD A2C:     39.5 ml LV SV MOD A4C:     108.5 ml LV SV MOD BP:      30.0 ml RIGHT VENTRICLE RV Basal diam:  2.82 cm TAPSE (M-mode): 2.1 cm LEFT ATRIUM             Index        RIGHT ATRIUM           Index LA diam:        3.40 cm 2.29 cm/m   RA Area:     12.30 cm LA Vol (A2C):   56.4 ml 37.92 ml/m  RA Volume:   32.00 ml  21.51 ml/m LA Vol (A4C):   49.3 ml 33.15 ml/m LA Biplane Vol: 55.6 ml 37.38 ml/m  AORTIC VALVE                 PULMONIC VALVE AV  Area (Vmax): 1.29 cm     PV Vmax:       0.87 m/s AV Vmax:        144.00 cm/s  PV Peak grad:  3.0 mmHg AV Peak Grad:   8.3 mmHg LVOT Vmax:      73.20 cm/s LVOT Vmean:     48.700 cm/s LVOT VTI:       0.114 m  AORTA Ao Root diam: 2.60 cm Ao Asc diam:  2.90 cm MITRAL VALVE                  TRICUSPID VALVE MV Area (PHT): 6.43 cm       TV Peak grad:   41.6 mmHg MV Decel Time: 118 msec       TV Vmax:        3.22 m/s MR Peak grad:    89.1 mmHg    TR Peak grad:   43.6 mmHg MR Mean grad:    52.0 mmHg    TR Vmax:        330.00 cm/s MR Vmax:         472.00 cm/s MR Vmean:        333.0 cm/s   SHUNTS MR PISA:         2.26 cm     Systemic VTI:  0.11 m MR PISA Eff ROA: 15 mm       Systemic Diam: 1.80 cm MR PISA Radius:  0.60 cm MV E velocity: 118.00 cm/s MV A velocity: 97.00 cm/s MV E/A ratio:  1.22 Chilton Si MD Electronically signed by Chilton Si MD Signature Date/Time: 07/17/2021/2:49:02 PM    Final    CT ANGIO HEAD NECK W WO CM W PERF (CODE STROKE)  Result Date: 07/17/2021 CLINICAL DATA:  Initial evaluation for acute stroke. EXAM: CT ANGIOGRAPHY HEAD  AND NECK TECHNIQUE: Multidetector CT imaging of the head and neck was performed using the standard protocol during bolus administration of intravenous contrast. Multiplanar CT image reconstructions and MIPs were obtained to evaluate the vascular anatomy. Carotid stenosis measurements (when applicable) are obtained utilizing NASCET criteria, using the distal internal carotid diameter as the denominator. RADIATION DOSE REDUCTION: This exam was performed according to the departmental dose-optimization program which includes automated exposure control, adjustment of the mA and/or kV according to patient size and/or use of iterative reconstruction technique. CONTRAST:  75mL OMNIPAQUE IOHEXOL 350 MG/ML SOLN COMPARISON:  Comparison made with prior head CT from 07/16/2021. FINDINGS: CTA NECK FINDINGS Aortic arch: Visualized aortic arch normal in caliber with normal 3 vessel morphology. Moderate atheromatous change about the origin of the great vessels without high-grade stenosis. Right carotid system: Right CCA patent from its origin to the bifurcation without significant stenosis. Mixed plaque about the right carotid bulb/proximal right ICA with associated stenosis of up to 50% by NASCET criteria. Right ICA patent distally without stenosis or dissection. Left carotid system: Left CCA patent to the bifurcation without significant stenosis. Eccentric calcified plaque about the left carotid bulb/proximal left ICA without hemodynamically significant stenosis. Left ICA patent distally without stenosis or dissection. Vertebral arteries: Both vertebral arteries arise from the subclavian arteries. No significant proximal subclavian artery stenosis. Right vertebral artery strongly dominant with a diffusely hypoplastic left vertebral artery. Atheromatous change at the origin of the dominant right vertebral artery with moderate ostial stenosis. Vertebral arteries otherwise patent distally without stenosis or dissection. Skeleton: No  discrete or worrisome osseous lesions. Mild for age cervical spondylosis. Other neck: Endotracheal and enteric tubes  in place. The enteric tube is partially coiled within the oral cavity before coursing inferiorly. 1 right-sided central venous catheter in place. No other acute soft tissue abnormality within the neck. Upper chest: Multifocal pneumonia noted within the partially visualized lungs. Layering bilateral pleural effusions partially visualized as well. Review of the MIP images confirms the above findings CTA HEAD FINDINGS Anterior circulation: Petrous segments patent bilaterally. Atheromatous change within the carotid siphons with associated mild multifocal narrowing. 3 mm left paraophthalmic aneurysm noted (series 7, image 263). A1 segments patent bilaterally. Normal anterior communicating artery complex. Anterior cerebral arteries patent to their distal aspects without stenosis. No M1 stenosis or occlusion. Normal MCA bifurcations. Distal MCA branches perfused and symmetric. Posterior circulation: Both V4 segments patent without stenosis. Right PICA patent. Left PICA not well seen. Basilar patent proximally. There is partially occlusive thrombus at the basilar tip (series 8, image 135). Superior cerebellar arteries remain patent distally. Both PCAs appear partially occluded at their origins, but remain patent and well perfused distally as well. Venous sinuses: Patent allowing for timing the contrast bolus. Anatomic variants: None significant. 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 Review of the MIP images confirms the above findings IMPRESSION: 1. 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 2. 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 3. 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 4. 3 mm left paraophthalmic aneurysm. 5. Multifocal pneumonia with layering bilateral  pleural effusions, partially visualized. Critical Value/emergent results were called by telephone at the time of interpretation on 07/17/2021 at 12:46 am to provider Dr. Karna Christmas, who verbally acknowledged these results. Electronically Signed   By: Rise Mu M.D.   On: 07/17/2021 00:55    Microbiology Recent Results (from the past 240 hour(s))  Resp Panel by RT-PCR (Flu A&B, Covid) Nasopharyngeal Swab     Status: None   Collection Time: 08/14/21  7:26 PM   Specimen: Nasopharyngeal Swab; Nasopharyngeal(NP) swabs in vial transport medium  Result Value Ref Range Status   SARS Coronavirus 2 by RT PCR NEGATIVE NEGATIVE Final    Comment: (NOTE) SARS-CoV-2 target nucleic acids are NOT DETECTED.  The SARS-CoV-2 RNA is generally detectable in upper respiratory specimens during the acute phase of infection. The lowest concentration of SARS-CoV-2 viral copies this assay can detect is 138 copies/mL. A negative result does not preclude SARS-Cov-2 infection and should not be used as the sole basis for treatment or other patient management decisions. A negative result may occur with  improper specimen collection/handling, submission of specimen other than nasopharyngeal swab, presence of viral mutation(s) within the areas targeted by this assay, and inadequate number of viral copies(<138 copies/mL). A negative result must be combined with clinical observations, patient history, and epidemiological information. The expected result is Negative.  Fact Sheet for Patients:  BloggerCourse.com  Fact Sheet for Healthcare Providers:  SeriousBroker.it  This test is no t yet approved or cleared by the [place] FDA and  has been authorized for detection and/or diagnosis of SARS-CoV-2 by FDA under an Emergency Use Authorization (EUA). This EUA will remain  in effect (meaning this test can be used) for the duration of the COVID-19 declaration  under Section 564(b)(1) of the Act, 21 U.S.C.section 360bbb-3(b)(1), unless the authorization is terminated  or revoked sooner.       Influenza A by PCR NEGATIVE NEGATIVE Final   Influenza B by PCR NEGATIVE NEGATIVE Final  Comment: (NOTE) The Xpert Xpress SARS-CoV-2/FLU/RSV plus assay is intended as an aid in the diagnosis of influenza from Nasopharyngeal swab specimens and should not be used as a sole basis for treatment. Nasal washings and aspirates are unacceptable for Xpert Xpress SARS-CoV-2/FLU/RSV testing.  Fact Sheet for Patients: BloggerCourse.com  Fact Sheet for Healthcare Providers: SeriousBroker.it  This test is not yet approved or cleared by the [place] FDA and has been authorized for detection and/or diagnosis of SARS-CoV-2 by FDA under an Emergency Use Authorization (EUA). This EUA will remain in effect (meaning this test can be used) for the duration of the COVID-19 declaration under Section 564(b)(1) of the Act, 21 U.S.C. section 360bbb-3(b)(1), unless the authorization is terminated or revoked.  Performed at [facility], [address] [place], [place] [address]   MRSA Next Gen by PCR, Nasal     Status: None   Collection Time: 07/17/2021  7:26 PM   Specimen: Nasal Mucosa; Nasal Swab  Result Value Ref Range Status   MRSA by PCR Next Gen NOT DETECTED NOT DETECTED Final    Comment: (NOTE) The GeneXpert MRSA Assay (FDA approved for NASAL specimens only), is one component of a comprehensive MRSA colonization surveillance program. It is not intended to diagnose MRSA infection nor to guide or monitor treatment for MRSA infections. Test performance is not FDA approved in patients less than 75 years old. Performed at [facility], [address], [place], [place] [address]   Urine Culture     Status: Abnormal   Collection Time: 07/16/21  4:48 AM   Specimen: Urine, Catheterized  Result  Value Ref Range Status   Specimen Description   Final    URINE, CATHETERIZED Performed at [facility], [address], [place], [place] [address]    Special Requests   Final    NONE Performed at [facility], [address] [place], [place] [address]    Culture (A)  Final    20,000 COLONIES/mL ESCHERICHIA COLI Confirmed Extended Spectrum Beta-Lactamase Producer (ESBL).  In bloodstream infections from ESBL organisms, carbapenems are preferred over piperacillin/tazobactam. They are shown to have a lower risk of mortality.    Report Status 07/19/2021 FINAL  Final   Organism ID, Bacteria ESCHERICHIA COLI (A)  Final      Susceptibility   Escherichia coli - MIC*    AMPICILLIN >=32 RESISTANT Resistant     CEFAZOLIN >=64 RESISTANT Resistant     CEFEPIME 16 RESISTANT Resistant     CEFTRIAXONE >=64 RESISTANT Resistant     CIPROFLOXACIN >=4 RESISTANT Resistant     GENTAMICIN <=1 SENSITIVE Sensitive     IMIPENEM <=0.25 SENSITIVE Sensitive     NITROFURANTOIN <=16 SENSITIVE Sensitive     TRIMETH/SULFA <=20 SENSITIVE Sensitive     AMPICILLIN/SULBACTAM >=32 RESISTANT Resistant     PIP/TAZO 8 SENSITIVE Sensitive     * 20,000 COLONIES/mL ESCHERICHIA COLI    Lab Basic Metabolic Panel: Recent Labs  Lab 07/16/21 0448 07/16/21 0716 07/16/21 1530 07/16/21 1856 07/17/21 0321 07/17/21 1004 07/17/21 1556 07/17/21 2216 07/07/2021 0320 07/12/2021 1345  NA 134* 136 142  --  134* 135 142 147* 148* 155*  K 5.4* 5.1 4.5  --  4.1  --   --   --  4.5  --   CL 103 104 104  --  99  --   --   --  110  --   CO2 19* 18* 17*  --  26  --   --   --  26  --   GLUCOSE 318* 326* 251*  --  156*  --   --   --  109*  --   BUN 20 22 25*  --  29*  --   --   --  44*  --   CREATININE 0.96 0.80 1.11*  --  1.21*  --   --   --  2.36*  --   CALCIUM 7.6* 7.6* 7.8*  --  7.0*  --   --   --  6.3*  --   MG 1.8  --   --  2.3 2.0  --   --   --  2.0  --   PHOS  --   --   --  7.8* 5.0*  --   --   --  5.3*  --     Liver Function Tests: Recent Labs  Lab 07/16/21 1530 07/31/2021 0320  AST 459* 1,086*  ALT 409* 329*  ALKPHOS 50 90  BILITOT 0.4 1.0  PROT <3.0* 3.9*  ALBUMIN 1.7* 2.9*   No results for input(s): LIPASE, AMYLASE in the last 168 hours. No results for input(s): AMMONIA in the last 168 hours. CBC: Recent Labs  Lab August 05, 2021 2033 07/16/21 0224 07/16/21 0448 07/16/21 0716 07/17/21 0321 07/17/21 1004 07/17/21 1556 07/17/21 2216 07/30/2021 0320 07/17/2021 1345  WBC 12.1*  --  20.7*  --  15.1*  --   --   --  8.8  --   NEUTROABS 10.1*  --   --   --  13.1*  --   --   --   --   --   HGB 9.1*   < > 9.3*   < > 7.4* 9.1* 8.8* 9.3* 9.0* 8.7*  HCT 27.2*   < > 27.5*   < > 21.7* 26.3* 26.0* 27.8* 27.2* 26.3*  MCV 92.2  --  92.0  --  92.7  --   --   --  97.1  --   PLT 283   292  --  215  --  121*  --   --   --  92*  --    < > = values in this interval not displayed.   Cardiac Enzymes: No results for input(s): CKTOTAL, CKMB, CKMBINDEX, TROPONINI in the last 168 hours. Sepsis Labs: Recent Labs  Lab 08/05/2021 2033 08-05-2021 2249 07/16/21 0448 07/16/21 0716 07/16/21 1856 07/16/21 2259 07/17/21 0321 07/17/21 0456 07/17/21 1004 07/14/2021 0320  WBC 12.1*  --  20.7*  --   --   --  15.1*  --   --  8.8  LATICACIDVEN  --    < > 4.7*   < > >9.0* 7.6*  --  5.2* 3.2*  --    < > = values in this interval not displayed.    Procedures/Operations  Patient underwent left lower extremity revascularization and after the procedure developed a superficial hematoma of the right groin which was monitored and not expanding.  A CT scan was performed demonstrating no retroperitoneal hematoma.  Patient had significant tachycardia that was then treated with antiarrhythmics postoperatively as well as an elevated troponin.  With this cardiac event, she developed a cardiopulmonary arrest with PEA.  Ultimately, after about 4 to 5 minutes of resuscitative efforts she was able to achieve ROSC but her condition continued  to deteriorate over the next 24 to 48 hours.  She developed significant strokes as well as worsening hypotension requiring pressors and ultimately there was a family meeting with  multiple physicians involved and it was clear she was not going to have meaningful recovery and she was made comfort care and passed away on 2021/07/21.   Festus Barren 07/22/2021, 1:33 PM

## 2021-08-04 NOTE — Progress Notes (Signed)
1520 patient extubated to 5L [place]  1533 patient pronounced by 2 Rns family with patient at bedside Drs notified

## 2021-08-04 NOTE — Therapy (Signed)
Pt terminally extubated to 5 LPM [place] at 1520p.

## 2021-08-04 NOTE — Progress Notes (Signed)
Subjective: Interval History: On the ventilator, unable to interview  Objective: Vital signs in last 24 hours: Temp:  [94.6 F (34.8 C)-103.1 F (39.5 C)] 94.6 F (34.8 C) (02/12 0713) Pulse Rate:  [59-137] 59 (02/12 0900) Resp:  [15-30] 15 (02/12 0915) BP: (93-135)/(53-80) 115/80 (02/12 0915) SpO2:  [78 %-96 %] 92 % (02/12 0600) FiO2 (%):  [40 %-100 %] 100 % (02/12 0830)  Intake/Output from previous day: 02/11 0701 - 02/12 0700 In: 4643.2 [I.V.:3744.4; Blood:390; IV Piggyback:458.8] Out: 515 [Urine:515] Intake/Output this shift: Total I/O In: 631.9 [I.V.:533; IV Piggyback:98.8] Out: 0   General appearance: On the ventilator, no meaningful response to noxious stimuli.  Please see neurology note with regard to physical examination findings for central neurologic function  Lower extremities demonstrate bilateral lower extremity ischemic changes from poor central cardiac function with profound cardiomyopathy.  Lab Results: Recent Labs    07/17/21 0321 07/17/21 1004 07/17/21 2216 07/14/2021 0320  WBC 15.1*  --   --  8.8  HGB 7.4*   < > 9.3* 9.0*  HCT 21.7*   < > 27.8* 27.2*  PLT 121*  --   --  92*   < > = values in this interval not displayed.   BMET Recent Labs    07/17/21 0321 07/17/21 1004 07/17/21 2216 07/12/2021 0320  NA 134*   < > 147* 148*  K 4.1  --   --  4.5  CL 99  --   --  110  CO2 26  --   --  26  GLUCOSE 156*  --   --  109*  BUN 29*  --   --  44*  CREATININE 1.21*  --   --  2.36*  CALCIUM 7.0*  --   --  6.3*   < > = values in this interval not displayed.    Studies/Results: CT ABDOMEN PELVIS WO CONTRAST  Result Date: 07/31/21 CLINICAL DATA:  Possible retroperitoneal bleed EXAM: CT ABDOMEN AND PELVIS WITHOUT CONTRAST TECHNIQUE: Multidetector CT imaging of the abdomen and pelvis was performed following the standard protocol without IV contrast. RADIATION DOSE REDUCTION: This exam was performed according to the departmental dose-optimization program  which includes automated exposure control, adjustment of the mA and/or kV according to patient size and/or use of iterative reconstruction technique. COMPARISON:  Chest x-ray 07/31/2021 FINDINGS: Lower chest: Lung bases demonstrate no acute consolidation or pleural effusion. Normal cardiac size. Coronary vascular calcification. Hepatobiliary: Hyperdensity in the gallbladder likely reflects vicarious excretion of contrast. No biliary dilatation Pancreas: Unremarkable. No pancreatic ductal dilatation or surrounding inflammatory changes. Spleen: Normal in size without focal abnormality. Adrenals/Urinary Tract: Adrenal glands are normal. Kidneys show no hydronephrosis. Excreted contrast in the renal collecting systems and bladder. Bladder decompressed by Foley catheter Stomach/Bowel: Moderate fluid distension of the stomach. No dilated small bowel. No acute bowel wall thickening. Vascular/Lymphatic: Advanced aortic atherosclerosis. No aneurysm. Stents within the bilateral superficial femoral arteries incompletely visualized. Lobulated hyperdense collection in the right groin measuring 5.1 by 2.7 by 8.5 cm consistent with hematoma. Extensive stranding and hyperdense fluid within the subcutaneous soft tissues anterior to the right hip, this extends over the right pubic area and into the right thigh but the inferior extent is incompletely visualized. Large hyperdense fluid collection/hematoma within the subcutaneous soft tissues, mostly superficial to the right rectus and lower quadrant abdominal wall. This extends to the right flank region. This measures approximately 14.2 cm transverse by 5.5 cm AP by 20.1 cm oblique craniocaudad. The collection is  difficult to separate from the underlying right rectus and a component of rectus sheath hematoma is possible. Reproductive: Uterus unremarkable.  No adnexal mass. Other: Negative for pelvic effusion or free air. Musculoskeletal: No acute osseous abnormality. IMPRESSION: 1.  Large hyperdense collection/hematoma primarily within the subcutaneous soft tissues superficial to the right lower quadrant abdominal wall and rectus, this extends to the right flank region above the level of umbilicus and inferiorly to the proximal thigh with the inferior extent incompletely visualized. There is a lobulated focal hyperdense collection/hematoma in the right groin anterior to the common femoral vessels consistent with groin hematoma. Correlation with sonography could be obtained to assess for pseudoaneurysm. 2. Critical Value/emergent results were called by telephone at the time of interpretation on 07/20/2021 at 10:21 pm to provider Webb SilversmithELIZABETH OUMA , who verbally acknowledged these results. Electronically Signed   By: Jasmine PangKim  Fujinaga M.D.   On: 2022-05-15 22:22   CT HEAD WO CONTRAST (5MM)  Result Date: 07/16/2021 CLINICAL DATA:  Altered mental status EXAM: CT HEAD WITHOUT CONTRAST TECHNIQUE: Contiguous axial images were obtained from the base of the skull through the vertex without intravenous contrast. RADIATION DOSE REDUCTION: This exam was performed according to the departmental dose-optimization program which includes automated exposure control, adjustment of the mA and/or kV according to patient size and/or use of iterative reconstruction technique. COMPARISON:  None. FINDINGS: Brain: No hemorrhage or intracranial mass is visualized. Focal hypodensity within the left thalamus. Patchy hypodensity within the pons and brainstem with diffuse hypodensity within the superior cerebellar hemispheres. Fourth ventricle patent. Some effacement of quadrigeminal plate cistern. Lateral and third ventricles are nondilated. Vascular: No hyperdense vessels.  Carotid vascular calcification Skull: Normal. Negative for fracture or focal lesion. Sinuses/Orbits: Fluid levels in the maxillary sinuses. Moderate mucosal thickening in the ethmoid sinuses Other: None IMPRESSION: 1. Negative for intracranial hemorrhage. 2.  Abnormal low attenuation involving the superior cerebellum in addition to abnormal low-density within the pons and patchy low density in the brainstem and left thalamus. Findings are suspicious for acute infarct. No hydrocephalus at this time. Correlation with CT angiography could be obtained to assess for basilar thrombosis though no hyperdense vessel seen on this non contrasted head CT. Electronically Signed   By: Jasmine PangKim  Fujinaga M.D.   On: 07/16/2021 22:15   CT Angio Chest Pulmonary Embolism (PE) W or WO Contrast  Result Date: 07/16/2021 CLINICAL DATA:  Shortness of breath, altered mental status, evaluate for PE EXAM: CT ANGIOGRAPHY CHEST WITH CONTRAST TECHNIQUE: Multidetector CT imaging of the chest was performed using the standard protocol during bolus administration of intravenous contrast. Multiplanar CT image reconstructions and MIPs were obtained to evaluate the vascular anatomy. RADIATION DOSE REDUCTION: This exam was performed according to the departmental dose-optimization program which includes automated exposure control, adjustment of the mA and/or kV according to patient size and/or use of iterative reconstruction technique. CONTRAST:  100mL OMNIPAQUE IOHEXOL 350 MG/ML SOLN COMPARISON:  Chest radiograph dated 07/16/2021 FINDINGS: Cardiovascular: Satisfactory opacification of the bilateral pulmonary arteries to the lobar level. No evidence of pulmonary embolism. No evidence of thoracic aortic aneurysm. Mild atherosclerotic calcifications. The heart is normal in size.  No pericardial effusion. Three vessel coronary atherosclerosis. Right IJ venous catheter terminates at the cavoatrial junction. Mediastinum/Nodes: Small mediastinal lymph nodes which do not meet pathologic CT size criteria. Visualized thyroid is unremarkable. Lungs/Pleura: Multifocal patchy opacities in the posterior upper and lower lobes, left lower lobe predominant, suspicious for multifocal pneumonia, possibly on the basis of  aspiration. Small bilateral  pleural effusions with associated mild compressive atelectasis in the bilateral lung bases. Mild biapical pleural-parenchymal scarring. No suspicious pulmonary nodules. No pneumothorax. Upper Abdomen: Evaluated on dedicated CT. Musculoskeletal: No focal osseous lesions. Review of the MIP images confirms the above findings. IMPRESSION: No evidence of pulmonary embolism. Multifocal pneumonia, left lower lobe predominant, possibly on the basis of aspiration. Small bilateral pleural effusions. Aortic Atherosclerosis (ICD10-I70.0). Electronically Signed   By: Charline Bills M.D.   On: 07/16/2021 22:12   MR BRAIN WO CONTRAST  Result Date: 07/17/2021 CLINICAL DATA:  Stroke, follow-up. Cardiac arrest. Basilar tip thrombosis. EXAM: MRI HEAD WITHOUT CONTRAST TECHNIQUE: Multiplanar, multiecho pulse sequences of the brain and surrounding structures were obtained without intravenous contrast. COMPARISON:  Head CT 07/16/2021, head and neck CTA 07/17/2021, and head MRI 04/02/2020 FINDINGS: Brain: As seen on CT, there are large acute infarcts involving the superior aspects of both cerebellar hemispheres. Acute infarcts are also present in the pons, left greater than right midbrain, and left thalamus. Additional punctate acute cortical and subcortical infarcts are scattered throughout both cerebral hemispheres, greatest posteriorly though with minor bilateral frontal lobe involvement as well. There is associated bilateral cerebellar edema and petechial hemorrhage with partial effacement of the basilar cisterns and fourth ventricle. The lateral and third ventricles are slightly more prominent than on yesterday CT. There is a small chronic infarct with chronic blood products in the medial left occipital lobe. Mild cerebral atrophy is within normal limits for age. No mass, midline shift, or extra-axial fluid collection is identified. Vascular: Major intracranial vascular flow voids are preserved.  Skull and upper cervical spine: Unremarkable bone marrow signal para Sinuses/Orbits: Unremarkable orbits. Subtotal opacification of the right maxillary sinus by fluid. Mild mucosal thickening in the other paranasal sinuses. Small right mastoid effusion. Other: None. IMPRESSION: 1. Large acute bilateral cerebellar infarcts with edema and petechial hemorrhage. Partial effacement of the basilar cisterns and fourth ventricle with possible early hydrocephalus. 2. Additional acute infarcts in the brainstem, left thalamus, and scattered peripherally throughout both cerebral hemispheres. Electronically Signed   By: Sebastian Ache M.D.   On: 07/17/2021 17:03   CT ABDOMEN PELVIS W CONTRAST  Result Date: 07/16/2021 CLINICAL DATA:  Follow-up retroperitoneal hematoma. EXAM: CT ABDOMEN AND PELVIS WITH CONTRAST TECHNIQUE: Multidetector CT imaging of the abdomen and pelvis was performed using the standard protocol following bolus administration of intravenous contrast. RADIATION DOSE REDUCTION: This exam was performed according to the departmental dose-optimization program which includes automated exposure control, adjustment of the mA and/or kV according to patient size and/or use of iterative reconstruction technique. CONTRAST:  OMNIPAQUE IOHEXOL 350 MG/ML SOLN COMPARISON:  CT abdomen pelvis dated 07/12/2021. FINDINGS: Lower chest: Partially visualized small bilateral pleural effusions with partial compressive atelectasis of the lower lobes versus pneumonia. No intra-abdominal free air.  Small free fluid in the pelvis. Hepatobiliary: The liver is unremarkable. No intrahepatic biliary dilatation. High attenuating content within the gallbladder may represent vicarious excretion of the contrast or sludge versus small stones. Pancreas: Unremarkable. No pancreatic ductal dilatation or surrounding inflammatory changes. Spleen: The spleen is somewhat small. Adrenals/Urinary Tract: The adrenal glands unremarkable. The kidneys,  and the visualized ureters are unremarkable. The urinary bladder is decompressed around a Foley catheter. Stomach/Bowel: Enteric tube is noted with tip in the body of the stomach. There is no bowel obstruction or active inflammation. The appendix is normal. Vascular/Lymphatic: Advanced aortoiliac atherosclerotic disease. Vascular stents noted in the proximal superficial femoral arteries which are not well evaluated but appear occluded.  The IVC is unremarkable. No portal venous gas. There is no adenopathy. Reproductive: The uterus is grossly unremarkable. Other: A compression device noted over the right groin. Significant decrease in the right anterior abdominal/pelvic wall intramuscular hematoma compared to prior CT as well as decrease in the size of the right groin hematoma. Musculoskeletal: Osteopenia with degenerative changes of the spine. No acute osseous pathology. IMPRESSION: 1. Significant decrease in the right anterior abdominal/pelvic wall intramuscular hematoma compared to prior CT as well as decrease in the size of the right groin hematoma. 2. No bowel obstruction. Normal appendix. 3. Partially visualized small bilateral pleural effusions with partial compressive atelectasis of the lower lobes versus pneumonia. 4. Aortic Atherosclerosis (ICD10-I70.0). Electronically Signed   By: Elgie Collard M.D.   On: 07/16/2021 22:10   PERIPHERAL VASCULAR CATHETERIZATION  Result Date: 07/24/2021 See surgical note for result.  DG Chest Port 1 View  Result Date: 07/17/2021 CLINICAL DATA:  HYPOXIA, HISTORY OF DM EXAM: PORTABLE CHEST 1 VIEW COMPARISON:  None. FINDINGS: Endotracheal tube terminates 1.5-2 cm above the carina. Enteric tube coursing below the hemidiaphragm with tip and side port collimated off view. Right internal jugular central venous catheter with tip overlying the right atrium. The heart and mediastinal contours are unchanged. Aortic calcification. Interval worsening of perihilar interstitial and  airspace opacities in a bat-wing-like configuration. At least bilateral trace to small volume pleural effusion. No pneumothorax. No acute osseous abnormality. IMPRESSION: 1. Interval development of pulmonary edema.  ARDS not excluded. 2. At least bilateral trace to small volume pleural effusions. 3. Endotracheal tube terminates 1.5-2 cm above the carina. 4. Enteric tube coursing below the hemidiaphragm with tip and side port collimated off view. 5.  Aortic Atherosclerosis (ICD10-I70.0). Electronically Signed   By: Tish Frederickson M.D.   On: 07/17/2021 22:31   DG Chest Port 1 View  Result Date: 07/16/2021 CLINICAL DATA:  Chest pain EXAM: PORTABLE CHEST 1 VIEW COMPARISON:  May 14, 2022 FINDINGS: Numerous radiopaque external lines overlie the chest. Defibrillator leads overlie the left hemithorax. Right IJ CVC with tip overlying the superior cavoatrial junction. Endotracheal tube with tip in the lower thoracic trachea. Enteric tube courses below the diaphragm with side port overlying the stomach and tip obscured by collimation. The heart size and mediastinal contours are within normal limits. Slightly reduced lung volumes with vascular crowding and hazy bilateral airspace opacities favored to reflect dependent atelectasis. No visible pleural effusion or pneumothorax. The visualized skeletal structures are unremarkable. IMPRESSION: 1. Slightly reduced lung volumes with vascular crowding and hazy bilateral airspace opacities favored to reflect dependent atelectasis. 2. Support lines and tubes as above. Electronically Signed   By: Maudry Mayhew M.D.   On: 07/16/2021 14:58   DG Chest Port 1 View  Result Date: 07/30/2021 CLINICAL DATA:  Central line placement EXAM: PORTABLE CHEST 1 VIEW COMPARISON:  None. FINDINGS: Right IJ central venous catheter tip over the cavoatrial region. No visible pneumothorax. No consolidation or effusion. Normal cardiac size. IMPRESSION: Right IJ central venous catheter tip over the  cavoatrial region. No pneumothorax Electronically Signed   By: Jasmine Pang M.D.   On: 07/14/2021 21:35   ECHOCARDIOGRAM COMPLETE  Result Date: 07/17/2021    ECHOCARDIOGRAM REPORT   Patient Name:   LATIFAH PADIN Date of Exam: 07/17/2021 Medical Rec #:  [id number]         Height:       64.0 in Accession #:    [id number]        Weight:  104.9 lb Date of Birth:  1951/01/31        BSA:          1.487 m Patient Age:    70 years          BP:           121/61 mmHg Patient Gender: F                 HR:           109 bpm. Exam Location:  [facility] Procedure: 2D Echo and Intracardiac Opacification Agent Indications:     CHF I50.21  History:         Patient has no prior history of Echocardiogram examinations.  Sonographer:     Overton Mamikeshia Johnson RDCS Referring Phys:  [id number] Raymon MuttonYAN M DUNN Diagnosing Phys: Chilton Siiffany Rocheport MD  Sonographer Comments: Echo performed with patient supine and on artificial respirator. Image acquisition challenging due to respiratory motion. IMPRESSIONS  1. Global hypokinesis with mid-apical anteroseptal, mid-apical inferoseptal, mid-apical anterior, apical, and apical lateral akinesis. Relative sparing of the basal segments. 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 Small apical thrombus (1 cm x 0.14 cm). Left ventricular ejection fraction, by estimation, is 20 to 25%. The left ventricle has severely decreased function. The left ventricle demonstrates global hypokinesis. The left ventricular internal cavity  size was mildly dilated. Left ventricular diastolic parameters are consistent with Grade II diastolic dysfunction (pseudonormalization). Elevated left ventricular end-diastolic pressure.  2. Right ventricular systolic function is normal. The right ventricular size is normal. There is moderately elevated pulmonary artery systolic pressure.  3. Left atrial size was mildly dilated.  4. Large pleural effusion in the left lateral region.  5. The mitral valve is  normal in structure. Mild to moderate mitral valve regurgitation. No evidence of mitral stenosis.  6. The aortic valve is normal in structure. Aortic valve regurgitation is not visualized. No aortic stenosis is present. FINDINGS  Left Ventricle: Global hypokinesis with mid-apical anteroseptal, mid-apical inferoseptal, mid-apical anterior, apical, and apical lateral akinesis. Relative sparing of the basal segments. 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 Small apical thrombus (1 cm x 0.14 cm). Left ventricular ejection fraction, by estimation, is 20 to 25%. The left ventricle has severely decreased function. The left ventricle demonstrates global hypokinesis. Definity contrast agent was given IV to delineate the left ventricular endocardial borders. The left ventricular internal cavity size was mildly dilated. There is no left ventricular hypertrophy. Left ventricular diastolic parameters are consistent with Grade II diastolic dysfunction (pseudonormalization). Elevated left ventricular end-diastolic pressure. Right Ventricle: The right ventricular size is normal. No increase in right ventricular wall thickness. Right ventricular systolic function is normal. There is moderately elevated pulmonary artery systolic pressure. The tricuspid regurgitant velocity is 3.30 m/s, and with an assumed right atrial pressure of 15 mmHg, the estimated right ventricular systolic pressure is 58.6 mmHg. Left Atrium: Left atrial size was mildly dilated. Right Atrium: Right atrial size was normal in size. Pericardium: There is no evidence of pericardial effusion. Mitral Valve: The mitral valve is normal in structure. There is mild thickening of the mitral valve leaflet(s). There is mild calcification of the mitral valve leaflet(s). Mild mitral annular calcification. Mild to moderate mitral valve regurgitation. No  evidence of mitral valve stenosis. Tricuspid Valve: The tricuspid valve is normal in  structure. Tricuspid valve regurgitation is mild . No evidence of tricuspid stenosis. Aortic Valve: The aortic valve is normal in structure. Aortic valve regurgitation  is not visualized. No aortic stenosis is present. Aortic valve peak gradient measures 8.3 mmHg. Pulmonic Valve: The pulmonic valve was normal in structure. Pulmonic valve regurgitation is trivial. No evidence of pulmonic stenosis. Aorta: The aortic root is normal in size and structure. Venous: IVC dilated 2.35 cm. IVC assessment for right atrial pressure unable to be performed due to mechanical ventilation. IAS/Shunts: No atrial level shunt detected by color flow Doppler. Additional Comments: There is a large pleural effusion in the left lateral region.  LEFT VENTRICLE PLAX 2D LVIDd:         5.10 cm      Diastology LVIDs:         4.34 cm      LV e' medial:    4.03 cm/s LV PW:         0.82 cm      LV E/e' medial:  29.3 LV IVS:        0.88 cm      LV e' lateral:   4.57 cm/s LVOT diam:     1.80 cm      LV E/e' lateral: 25.8 LV SV:         29 LV SV Index:   20 LVOT Area:     2.54 cm                              3D Volume EF: LV Volumes (MOD)            3D EF:        35 % LV vol d, MOD A2C: 132.0 ml LV EDV:       104 ml LV vol d, MOD A4C: 108.5 ml LV ESV:       67 ml LV vol s, MOD A2C: 92.5 ml  LV SV:        37 ml LV vol s, MOD A4C: 86.6 ml LV SV MOD A2C:     39.5 ml LV SV MOD A4C:     108.5 ml LV SV MOD BP:      30.0 ml RIGHT VENTRICLE RV Basal diam:  2.82 cm TAPSE (M-mode): 2.1 cm LEFT ATRIUM             Index        RIGHT ATRIUM           Index LA diam:        3.40 cm 2.29 cm/m   RA Area:     12.30 cm LA Vol (A2C):   56.4 ml 37.92 ml/m  RA Volume:   32.00 ml  21.51 ml/m LA Vol (A4C):   49.3 ml 33.15 ml/m LA Biplane Vol: 55.6 ml 37.38 ml/m  AORTIC VALVE                 PULMONIC VALVE AV Area (Vmax): 1.29 cm     PV Vmax:       0.87 m/s AV Vmax:        144.00 cm/s  PV Peak grad:  3.0 mmHg AV Peak Grad:   8.3 mmHg LVOT Vmax:      73.20 cm/s LVOT  Vmean:     48.700 cm/s LVOT VTI:       0.114 m  AORTA Ao Root diam: 2.60 cm Ao Asc diam:  2.90 cm MITRAL VALVE                  TRICUSPID VALVE MV  Area (PHT): 6.43 cm       TV Peak grad:   41.6 mmHg MV Decel Time: 118 msec       TV Vmax:        3.22 m/s MR Peak grad:    89.1 mmHg    TR Peak grad:   43.6 mmHg MR Mean grad:    52.0 mmHg    TR Vmax:        330.00 cm/s MR Vmax:         472.00 cm/s MR Vmean:        333.0 cm/s   SHUNTS MR PISA:         2.26 cm     Systemic VTI:  0.11 m MR PISA Eff ROA: 15 mm       Systemic Diam: 1.80 cm MR PISA Radius:  0.60 cm MV E velocity: 118.00 cm/s MV A velocity: 97.00 cm/s MV E/A ratio:  1.22 Chilton Si MD Electronically signed by Chilton Si MD Signature Date/Time: 07/17/2021/2:49:02 PM    Final    CT ANGIO HEAD NECK W WO CM W PERF (CODE STROKE)  Result Date: 07/17/2021 CLINICAL DATA:  Initial evaluation for acute stroke. EXAM: CT ANGIOGRAPHY HEAD AND NECK TECHNIQUE: Multidetector CT imaging of the head and neck was performed using the standard protocol during bolus administration of intravenous contrast. Multiplanar CT image reconstructions and MIPs were obtained to evaluate the vascular anatomy. Carotid stenosis measurements (when applicable) are obtained utilizing NASCET criteria, using the distal internal carotid diameter as the denominator. RADIATION DOSE REDUCTION: This exam was performed according to the departmental dose-optimization program which includes automated exposure control, adjustment of the mA and/or kV according to patient size and/or use of iterative reconstruction technique. CONTRAST:  75mL OMNIPAQUE IOHEXOL 350 MG/ML SOLN COMPARISON:  Comparison made with prior head CT from 07/16/2021. FINDINGS: CTA NECK FINDINGS Aortic arch: Visualized aortic arch normal in caliber with normal 3 vessel morphology. Moderate atheromatous change about the origin of the great vessels without high-grade stenosis. Right carotid system: Right CCA patent from its  origin to the bifurcation without significant stenosis. Mixed plaque about the right carotid bulb/proximal right ICA with associated stenosis of up to 50% by NASCET criteria. Right ICA patent distally without stenosis or dissection. Left carotid system: Left CCA patent to the bifurcation without significant stenosis. Eccentric calcified plaque about the left carotid bulb/proximal left ICA without hemodynamically significant stenosis. Left ICA patent distally without stenosis or dissection. Vertebral arteries: Both vertebral arteries arise from the subclavian arteries. No significant proximal subclavian artery stenosis. Right vertebral artery strongly dominant with a diffusely hypoplastic left vertebral artery. Atheromatous change at the origin of the dominant right vertebral artery with moderate ostial stenosis. Vertebral arteries otherwise patent distally without stenosis or dissection. Skeleton: No discrete or worrisome osseous lesions. Mild for age cervical spondylosis. Other neck: Endotracheal and enteric tubes in place. The enteric tube is partially coiled within the oral cavity before coursing inferiorly. 1 right-sided central venous catheter in place. No other acute soft tissue abnormality within the neck. Upper chest: Multifocal pneumonia noted within the partially visualized lungs. Layering bilateral pleural effusions partially visualized as well. Review of the MIP images confirms the above findings CTA HEAD FINDINGS Anterior circulation: Petrous segments patent bilaterally. Atheromatous change within the carotid siphons with associated mild multifocal narrowing. 3 mm left paraophthalmic aneurysm noted (series 7, image 263). A1 segments patent bilaterally. Normal anterior communicating artery complex. Anterior cerebral arteries patent to their distal aspects without  stenosis. No M1 stenosis or occlusion. Normal MCA bifurcations. Distal MCA branches perfused and symmetric. Posterior circulation: Both V4  segments patent without stenosis. Right PICA patent. Left PICA not well seen. Basilar patent proximally. There is partially occlusive thrombus at the basilar tip (series 8, image 135). Superior cerebellar arteries remain patent distally. Both PCAs appear partially occluded at their origins, but remain patent and well perfused distally as well. Venous sinuses: Patent allowing for timing the contrast bolus. Anatomic variants: None significant. 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 Review of the MIP images confirms the above findings IMPRESSION: 1. 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 2. 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 3. 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 4. 3 mm left paraophthalmic aneurysm. 5. Multifocal pneumonia with layering bilateral pleural effusions, partially visualized. Critical Value/emergent results were called by telephone at the time of interpretation on 07/17/2021 at 12:46 am to provider Dr. Karna Christmas, who verbally acknowledged these results. Electronically Signed   By: Rise Mu M.D.   On: 07/17/2021 00:55   Anti-infectives: Anti-infectives (From admission, onward)    Start     Dose/Rate Route Frequency Ordered Stop   07/16/21 0645  cefTRIAXone (ROCEPHIN) 1 g in sodium chloride 0.9 % 100 mL IVPB        1 g 200 mL/hr over 30 Minutes Intravenous Every 24 hours 07/16/21 0550     10-Aug-2021 0115  ceFAZolin (ANCEF) IVPB 2g/100 mL premix        2 g 200 mL/hr over 30 Minutes Intravenous  Once 10-Aug-2021 0105 07/16/21 6226       Assessment/Plan: s/p Procedure(s): Lower Extremity Angiography (Left) Plan:  At this point in time, her chances of any meaningful recovery are absent.  She has an extremely dense neurologic injury that she will not recover from.  She has very poor cardiac function as well, with likely cardiac  source for her emboli leading to neurologic injury.  I discussed patient's condition and prognosis for approximately 1/2-hour separately with 2 family members this morning, and then was joined by both neurology Dr. Wilford Corner, and then and critical care, Dr. Karna Christmas with the family.  The issue is that she is demonstrating no capacity for meaningful improvement.  This was discussed in no uncertain terms with the family present by Dr. Karna Christmas and myself for approximately 20 additional minutes.  The issue of comfort at this point with ventilator cessation is felt to be the better course as she has no chance of meaningful recovery; continuing artificial forms of life support would only prolong suffering.  The family understands this, and has agreed with this course of action.  They wish for her immediate family to be present, a request which Dr. Karna Christmas has accomodated.  All questions were answered as proposed by the family.  The presumed outcome of the course of action above would be patient to pass at some time over the next 24 hours.  Again the patient's family understands this.  Learta Codding 07/17/2021, 9:52 AM

## 2021-08-04 NOTE — Consult Note (Signed)
ANTICOAGULATION CONSULT NOTE  Pharmacy Consult for heparin Indication: stroke  No Known Allergies  Patient Measurements: Height: 5\' 4"  (162.6 cm) Weight: 47.6 kg (104 lb 15 oz) IBW/kg (Calculated) : 54.7 Heparin Dosing Weight: 47.6 kg  Vital Signs: Temp: 101.3 F (38.5 C) (02/12 0400) Temp Source: Esophageal (02/12 0400) BP: 97/67 (02/12 0400) Pulse Rate: 125 (02/12 0400)  Labs: Recent Labs    07/20/2021 2033 08/03/2021 2249 07/16/21 0448 07/16/21 0639 07/16/21 1530 07/16/21 1856 07/16/21 1858 07/16/21 2259 07/17/21 0321 07/17/21 1004 07/17/21 1556 07/17/21 2216 2021-08-09 0320  HGB 9.1*   < > 9.3*   < >  --   --    < >  --  7.4*   < > 8.8* 9.3* 9.0*  HCT 27.2*   < > 27.5*   < >  --   --    < >  --  21.7*   < > 26.0* 27.8* 27.2*  PLT 283   292  --  215  --   --   --   --   --  121*  --   --   --  92*  APTT 38*  --   --   --   --   --   --   --   --   --   --   --   --   LABPROT 16.7*  --   --   --   --   --   --   --   --   --   --   --   --   INR 1.4*  --   --   --   --   --   --   --   --   --   --   --   --   HEPARINUNFRC  --   --   --   --   --   --   --   --   --   --   --   --  <0.10*  CREATININE  --    < > 0.96   < > 1.11*  --   --   --  1.21*  --   --   --  2.36*  TROPONINIHS  --   --   --    < > 3,635* 13,952*  --  >24,000*  --   --   --   --   --    < > = values in this interval not displayed.     Estimated Creatinine Clearance: 16.2 mL/min (A) (by C-G formula based on SCr of 2.36 mg/dL (H)).   Medical History: Past Medical History:  Diagnosis Date   Diabetes mellitus without complication (HCC)     Medications:  No PTA anticoagulation, only Plavix 75 mg daily and aspirin 81 mg daily  Assessment: 71 y.o female PMH of Diabetes Mellitus, Memory Loss and PAD with extensive BLE revascularization who presented to the hospital for elective Left Lower Extremity Angiography. Following surgery patient developed right groin hematoma. Patient had PEA arrest on  2/10 and new findings of embolic stroke on 2/11. Pharmacy has been consulted for heparin dosing per stroke protocol.   Baseline labs: hgb 8.8, plts, 121, aPTT 38, INR 1.4  Goal of Therapy:  Heparin level 0.3-0.5 units/ml Monitor platelets by anticoagulation protocol: Yes  2/12 0320 HL < 0.10, subtherapeutic   Plan:  Increase heparin infusion to 750 units/hr Recheck HL in 8 hrs  after rate change. CBC daily while on heparin  Otelia Sergeant, PharmD, [facility] 08/02/2021 4:45 AM

## 2021-08-04 NOTE — TOC Progression Note (Addendum)
Transition of Care [facility]) - Progression Note    Patient Details  Name: Carrie Hernandez MRN: [id number] Date of Birth: 11-17-1950  Transition of Care [facility]) CM/SW Contact  Merrily Brittle, [place] Phone Number: 07/31/2021, 10:35 AM  Clinical Narrative:    CSW was informed about citizenship issue interfering with patient's family's ability to come to [place] from [place] to be with patient. CSW provided contact for [place] and told the family to call them tomorrow. Nurse Quentin Angst provided paperwork to family for embassy to verify information. No further TOC needs.    Expected Discharge Plan: Home w Home Health Services Barriers to Discharge: Continued Medical Work up  Expected Discharge Plan and Services Expected Discharge Plan: Home w Home Health Services In-house Referral: Interpreting Services Discharge Planning Services: CM Consult Post Acute Care Choice: Home Health Living arrangements for the past 2 months: Single Family Home Expected Discharge Date: 07/26/21                                     Social Determinants of Health (SDOH) Interventions    Readmission Risk Interventions Readmission Risk Prevention Plan 07/16/2021  Transportation Screening Complete  PCP or Specialist Appt within 5-7 Days Complete  Home Care Screening Complete  Medication Review (RN CM) Complete  Some recent data might be hidden

## 2021-08-04 NOTE — Progress Notes (Signed)
0900 ICU DR, Neuro DR, and Vascular DR all seen patient and spoke with family regarding patient prognosis family in agreement to go comfort after all family sees patient today. Family ask that a letter be provided to assist with travel to [place] from [place] papers all faxed with family birth certificated to provided fax from Child psychotherapist.

## 2021-08-04 NOTE — Progress Notes (Signed)
Neurology Progress Note   S:// Patient seen and examined this morning. Multiple family members at bedside including children, grandchildren, nephews and nieces. Discussed the plan of care with multiple family members as well as PCCM attending, vascular surgery attending.   O:// Current vital signs: BP 115/80    Pulse (!) 59    Temp (!) 94.6 F (34.8 C) (Esophageal)    Resp 15    Ht 5' 4" (1.626 m)    Wt 47.6 kg    SpO2 92%    BMI 18.01 kg/m  Vital signs in last 24 hours: Temp:  [94.6 F (34.8 C)-103.1 F (39.5 C)] 94.6 F (34.8 C) (02/12 0713) Pulse Rate:  [59-137] 59 (02/12 0900) Resp:  [15-30] 15 (02/12 0915) BP: (93-135)/(53-80) 115/80 (02/12 0915) SpO2:  [78 %-96 %] 92 % (02/12 0600) FiO2 (%):  [40 %-100 %] 100 % (02/12 0830)  General: Very sick looking, intubated, no sedation. HEENT: Normocephalic/atraumatic Lungs: Vented CVS: Regular rate rhythm Abdomen: Nondistended Extremities: Right groin hematoma appears stable Neurological exam Intubated, no sedation No spontaneous movements No response to noxious stimulation or loud voice.  Does not open eyes with sternal rub Left pupil 5 mm fixed with absent corneal reflex, right pupil 3 mm extremely sluggishly reactive with a weak corneal reflex.  Facial symmetry difficult to ascertain due to endotracheal tube. Does breathe over the ventilator Unlike yesterday, no movement noticed on the left upper extremity spontaneously or to noxious stimulation today.  Flaccid in all 4 extremities.  Medications  Current Facility-Administered Medications:    0.9 %  sodium chloride infusion (Manually program via Guardrails IV Fluids), , Intravenous, Once, [place], [employer], NP   0.9 %  sodium chloride infusion (Manually program via Guardrails IV Fluids), , Intravenous, Once, [place], [employer], NP   0.9 %  sodium chloride infusion (Manually program via Guardrails IV Fluids), , Intravenous, Once, Teressa Lower, NP, Stopped at  07/16/21 1610   0.9 %  sodium chloride infusion, 250 mL, Intravenous, PRN, Algernon Huxley, MD, Stopped at 07/16/21 1541   0.9 %  sodium chloride infusion, 250 mL, Intravenous, Continuous, Tawnya Crook, RPH, Stopped at 07/16/21 1541   acetaminophen (TYLENOL) suppository 650 mg, 650 mg, Rectal, Q4H PRN, Ottie Glazier, MD, 650 mg at 07/17/21 1405   acetaminophen (TYLENOL) tablet 650 mg, 650 mg, Per Tube, Q4H PRN, Teressa Lower, NP, 650 mg at 07/17/21 2328   atorvastatin (LIPITOR) tablet 10 mg, 10 mg, Per Tube, QHS, Teressa Lower, NP   cefTRIAXone (ROCEPHIN) 1 g in sodium chloride 0.9 % 100 mL IVPB, 1 g, Intravenous, Q24H, [place]uma, Bing Neighbors, NP, Stopped at Aug 08, 2021 [id number]   chlorhexidine gluconate (MEDLINE KIT) (PERIDEX) 0.12 % solution 15 mL, 15 mL, Mouth Rinse, BID, Rust-Chester, Britton L, NP, 15 mL at August 08, 2021 9563   Chlorhexidine Gluconate Cloth 2 % PADS 6 each, 6 each, Topical, Q0600, Algernon Huxley, MD, 6 each at 07/16/21 0526   dextrose 5 % in lactated ringers infusion, , Intravenous, Continuous, Rust-Chester, Huel Cote, NP, Stopped at Aug 08, 2021 0602   docusate (COLACE) 50 MG/5ML liquid 100 mg, 100 mg, Per Tube, BID, Teressa Lower, NP   fentaNYL (SUBLIMAZE) bolus via infusion 25-100 mcg, 25-100 mcg, Intravenous, Q2H PRN, Rust-Chester, Britton L, NP   fentaNYL 2528mg in NS 2527m(1066mml) infusion-PREMIX, 0-200 mcg/hr, Intravenous, Continuous, Rust-Chester, BriHuel CoteP, Stopped at 07/16/21 2241   gabapentin (NEURONTIN) capsule 300 mg, 300 mg, Oral, QHS, Dew, JasErskine SquibbD  heparin ADULT infusion 100 units/mL (25000 units/236m), 750 Units/hr, Intravenous, Continuous, Belue, NAlver Sorrow RPH, Last Rate: 7.5 mL/hr at [id number]-Mar-[id number], 750 Units/hr at [id number]   hydrALAZINE (APRESOLINE) injection 5 mg, 5 mg, Intravenous, Q20 Min PRN, Dew, JErskine Squibb MD   insulin aspart (novoLOG) injection 0-15 Units, 0-15 Units, Subcutaneous, Q4H, NTeressa Lower NP, 2 Units at 07/17/21 0334   MEDLINE mouth  rinse, 15 mL, Mouth Rinse, 10 times per day, Rust-Chester, BToribio HarbourL, NP, 15 mL at 0March 05, [id number]   midazolam (VERSED) injection 1 mg, 1 mg, Intravenous, Q15 min PRN, NTeressa Lower NP, 1 mg at 0March 05, [id number]   midazolam (VERSED) injection 1-2 mg, 1-2 mg, Intravenous, Q2H PRN, NTeressa Lower NP, 2 mg at 07/17/21 2114   norepinephrine (LEVOPHED) 439min 2504m0.016 mg/mL) premix infusion, 0-40 mcg/min, Intravenous, Titrated, NelTeressa LowerP, Stopped at 07/17/21 1119   ondansetron (ZOFRAN) injection 4 mg, 4 mg, Intravenous, Q6H PRN, BroKris HartmannP   ondansetron (ZOFRAN) injection 4 mg, 4 mg, Intravenous, Q6H PRN, DewLucky CowboyasErskine SquibbD   phenylephrine CONCENTRATED 100m41m sodium chloride 0.9% 250mL35m4mg/m56mpremix infusion, 0-400 mcg/min, Intravenous, Titrated, Ouma, ElizabBing NeighborsStopped at 07/17/21 1416   polyethylene glycol (MIRALAX / GLYCOLAX) packet 17 g, 17 g, Per Tube, Daily, NelsonTeressa Lower sodium bicarbonate 150 mEq in sterile water 1,150 mL infusion, , Intravenous, Continuous, NelsonTeressa LowerLast Rate: 100 mL/hr at 07/20/2021-03-05 Infusion Verify at 07/18/01/10/2021  sodium chloride (hypertonic) 3 % solution, , Intravenous, Continuous, Johanny Segers,Amie PortlandLast Rate: 75 mL/hr at 07/19/03-Mar-2023 Infusion Verify at 07/19/03-Mar-2023  sodium chloride flush (NS) 0.9 % injection 10-40 mL, 10-40 mL, Intracatheter, Q12H, Rust-Chester, Britton L, NP, 30 mL at 07/18/01-10-2021  sodium chloride flush (NS) 0.9 % injection 10-40 mL, 10-40 mL, Intracatheter, PRN, Rust-Chester, Britton L, NP   sodium chloride flush (NS) 0.9 % injection 3 mL, 3 mL, Intravenous, Q12H, Dew, Jason Erskine Squibb3 mL at 07/18/01-05-23  sodium chloride flush (NS) 0.9 % injection 3 mL, 3 mL, Intravenous, PRN, Dew, JLucky Cowboyn Erskine Squibb traZODone (DESYREL) tablet 50 mg, 50 mg, Oral, QHS, Dew, Jason Erskine Squibb vasopressin (PITRESSIN) 20 Units in sodium chloride 0.9 % 100 mL infusion-*FOR SHOCK*, 0-0.03 Units/min, Intravenous, Continuous,  NelsonTeressa LowerStopped at 07/17/21 0709 Labs CBC    Component Value Date/Time   WBC 8.8 07/19/03-Mar-2023  RBC 2.80 (L) 02/12/August 08, 2021  HGB 9.0 (L) 07/18/01-05-23  HCT 27.2 (L) 02/12/Aug 08, 2021  PLT 92 (L) 07/23/2021-03-05  MCV 97.1 07/11/2021/03/05  MCH 32.1 07/18/01-05-23  MCHC 33.1 02/12/August 08, 2021  RDW 15.4 07/18/01-10-2021  LYMPHSABS 1.2 07/17/2021 0321   MONOABS 0.7 07/17/2021 0321   EOSABS 0.0 07/17/2021 0321   BASOSABS 0.0 07/17/2021 0321    CMP     Component Value Date/Time   NA 148 (H) 07/18/01/05/23  K 4.5 07/18/01/10/2021  CL 110 07/18/01/10/2021  CO2 26 07/10/2021-03-05  GLUCOSE 109 (H) 07/18/01/05/23  BUN 44 (H) 07/18/01/05/23  CREATININE 2.36 (H) 02/12/Aug 08, 2021  CALCIUM 6.3 (LL) 08/03/2021/03/05  PROT 3.9 (L) 07/26/2021-03-05  ALBUMIN 2.9 (L) 07/07/2021/03/05  AST 1,086 (H) 02/12/August 08, 2021  ALT 329 (H) 07/18/01-10-2021  ALKPHOS 90 2021/07/28 0320   BILITOT 1.0 Jul 28, 2021 0320   GFRNONAA 21 (L) 07/28/2021 0320   GFRAA >60 05/12/2016 1816    glycosylated hemoglobin  Lipid Panel     Component Value Date/Time   CHOL 34 07/17/2021 0321   TRIG 49 07/17/2021 0321   HDL 16 (L) 07/17/2021 0321   CHOLHDL 2.1 07/17/2021 0321   VLDL 10 07/17/2021 0321   LDLCALC 8 07/17/2021 0321    2D echo with global hypokinesis, mid apical anteroseptal mid apical inferior septal mid apical anterior, apical and apical lateral akinesis.  Small apical thrombus.  EF 20 to 25%.  Left atrium mildly dilated.  Large pleural effusion in the left lateral margin.  Mitral valve normal.  Aortic valve normal.  Imaging Reviewed yesterday-see detailed review in the consult note.  No new head imaging to review since yesterday.  Assessment:  71 year old woman with extensive history of peripheral arterial disease, lower extremity revascularizations, diabetes who came in for left lower extremity angiography complicated by groin hematoma and abdominal wall hematoma  followed by a brief duration of cardiac arrest noted to have anisocoria and further imaging revealed strokes in the superior cerebellar artery territory, left midbrain and left thalamus as well as scattered embolic infarcts in bilateral cerebral hemispheres indicative of central etiology. She was moving her left arm some to noxious stimulation yesterday but there is a is flaccid in all 4 extremities.  No spontaneous movement noted.  Left pupil remains fixed and dilated with sluggish response of the right pupil. Her renal function is also worsening along with her liver function which is also worsening.  She was started on hypertonic saline as well as heparin drip due to the LV thrombus and recent Bolick strokes. Exam today is much worse with minimal brainstem function present.  Detailed meeting with multiple family members in the [facility] yesterday along with detailed discussion this morning as well with multiple family members. Prognosis regarding survival is extremely guarded but extremely poor prognosis for any chances of neurologically meaningful recovery if she were to survive this. Family has eventually decided to move towards comfort measures.  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 -Severe cardiomyopathy -Peripheral arterial disease -Diabetes  Recommendations: As the family has chosen to pursue comfort measures only, no further interventions from a neurological standpoint.  Institution of comfort measure order set per primary team as you are. I will be available as needed.  Please call with questions. Plan was discussed in detail with Drs. Aleskerov and [employer].  -- Amie Portland, MD Neurologist [facility] Pager: [phone]  CRITICAL CARE ATTESTATION Performed by: Amie Portland, MD Total critical care time: 39  minutes Critical care time was exclusive of separately billable procedures and treating other patients and/or supervising APPs/Residents/Students Critical care was necessary to treat or prevent imminent or life-threatening deterioration due to acute basilar occlusion, acute ischemic strokes This patient is critically ill and at significant risk for neurological worsening and/or death and care requires constant monitoring. Critical care was time spent personally by me on the following activities: development of treatment plan with patient and/or surrogate as well as nursing, discussions with consultants, evaluation of patient's response to treatment, examination of patient, obtaining history from patient or surrogate, ordering and performing treatments and interventions, ordering and review of laboratory studies, ordering and review of radiographic studies, pulse oximetry, re-evaluation of patient's condition, participation in multidisciplinary  rounds and medical decision making of high complexity in the care of this patient.

## 2021-08-04 NOTE — Progress Notes (Signed)
NAME:  Carrie Hernandez, MRN:  [id number], DOB:  1950-09-27, LOS: 3 ADMISSION DATE:  07/22/2021, CONSULTATION DATE:  07/14/2021 REFERRING MD:  Leotis Pain, MD CHIEF COMPLAINT:  Right Groin Hematoma    HPI  71 y.o Hispanic female with significant PMH of Diabetes Mellitus, Memory Loss and PAD with extensive BLE revascularization who presented to the hospital for elective Left Lower Extremity Angiography.  Hospital Course: Patient underwent Angiography of the Left Lower Extremity Angiography from the right groin femoral approach. Following completion of surgery, the sheath was removed and StarClose closure device was deployed in the right femoral artery with excellent hemostatic result. The patient was taken to the recovery room in stable condition having tolerated the procedure well. Patient was later transferred to the ICU for close monitoring.  On arrival to the ICU, patient was noted with significant right groin pain and swelling.  She was diaphoretic and appeared uncomfortable.  Vital signs were: temperature was 37.1C, the heart rate 139 beats/minute, the blood pressure 76/56 mm Hg, the respiratory rate 23 breaths/minute, and the oxygen saturation 100% on 2L. She was lethargic, moaned intermittently, and responded with one-word answers; symmetric movement in the arms and legs was observed. Manual compression was applied at the groin site for 20 minutes, patient started on IVFs resuscitation and STAT labs obtained. PCCM consulted   07/16/21-patient had cardiac arrest today.  She was in shock and was noted to tachyarrythmia with asystole.  She had ACLS with ROSC and had cardiology consult.   07/17/21- Patient remains critically ill.  She had new findings of embolic stroke which is not amenable to intervention from neuro interventional perspective. She had cardiology evaluation today and yesterday with bedside TTE showing LVEF ~20% and seems to have coagulopathy due to findings of previous DVT and now  showering emboli.  She may have undiagnosed malignancy as culprit of current severe embolic phenomenon from long hx of lifelong smoking.  We had long family meeting today with numerous family members.  She had MRI of brain today and neurologist was able to review that with them as well. She is with very poor prognosis and has numerous and severe comorbid conditions.    Aug 03, 2021- patient continues to deteriorate and is at cusp of death with multi-organ failure. She had additional worsening overnight with increased FiO2 on MV as well as development of kidney failure and shock liver. This coupled with severe acute embolic CVA and underlying severe chronic comorbid status portends a very poor prognosis. At this time patient may actually still feel pain and suffocation.  Immediate family members were present at bedside and multiple specialists were able to meet with them together with me (vascular surgery Dr Shelia Media, neurology Dr Rory Percy and PCCM myself).  Family has asked if we are able to immigrate several people from [place] to come here.  We explained that at this point patient is at high risk of death without additional therapy available.  We recommend palliative care with comfort measures to allow patient to pass away naturally without suffering.  I recommend to discontinue artifical life support and initiate comfort measures due to all of these findings. Family understands there is no additional interventions offered and does request to initiate comfort measures to allow patient pass away naturally.Wilburn Mylar we had 36 individuals show up stating they are family and it was very overwhelming for staff so we have asked family to please limit according to healthcare policy.    Past Medical History  Diabetes Mellitus,  Memory Loss and PAD with extensive BLE revascularization  Significant Hospital Events   2/9: Presented  for elective  surgery s/p Angiography of the Left Lower Extremity Angiography from the right  groin femoral approach. Transferred to ICU post op.  Consults:  PCCM Vascular  Procedures:  2/9: s/p Angiography of the Left Lower Extremity Angiography from the right groin femoral approach  Significant Diagnostic Tests:  2/9: CTA abdomen and pelvis>. Large hyperdense collection/hematoma primarily within the subcutaneous soft tissues superficial to the right lower quadrant abdominal wall and rectus, this extends to the right flank region above the level of umbilicus and inferiorly to the proximal thigh with the inferior extent incompletely visualized. There is a lobulated focal hyperdense collection/hematoma in the right groin anterior to the common femoral vessels consistent with groin hematoma. Correlation with sonography could be obtained to assess for pseudoaneurysm.  Micro Data:  2/9: SARS-CoV-2 PCR> negative 2/9: Influenza PCR> negative  Antimicrobials:  None  OBJECTIVE  Blood pressure 115/80, pulse (!) 59, temperature (!) 94.6 F (34.8 C), temperature source Esophageal, resp. rate 15, height 5' 4"  (1.626 m), weight 47.6 kg, SpO2 92 %. CVP:  [2 mmHg-16 mmHg] 11 mmHg  Vent Mode: PRVC FiO2 (%):  [40 %-100 %] 100 % Set Rate:  [20 bmp-25 bmp] 25 bmp Vt Set:  [400 mL-440 mL] 440 mL PEEP:  [5 cmH20-10 cmH20] 10 cmH20 Plateau Pressure:  [19 cmH20-25 cmH20] 25 cmH20   Intake/Output Summary (Last 24 hours) at 2021-07-26 1008 Last data filed at July 26, 2021 0900 Gross per 24 hour  Intake 4563.84 ml  Output 440 ml  Net 4123.84 ml    Filed Weights   07/09/2021 1305 07/29/2021 1910  Weight: 49.9 kg 47.6 kg   Physical Examination  GENERAL: Age appropriate EYES: Pupils equal, round, reactive to light and accommodation. No scleral icterus. Extraocular muscles intact.  HEENT: Head atraumatic, normocephalic. Oropharynx and nasopharynx clear. +ett NECK:  Supple, no jugular venous distention. No thyroid enlargement, no tenderness.  LUNGS: Normal breath sounds bilaterally, no wheezing,  rales,rhonchi or crepitation. No use of accessory muscles of respiration.  CARDIOVASCULAR: S1, S2 normal. No murmurs, rubs, or gallops.  ABDOMEN: Soft, nontender, nondistended. Bowel sounds present. No organomegaly or mass.  EXTREMITIES: No pedal edema, cyanosis, or clubbing. Right groin hematoma/bruising NEUROLOGIC: gcs 4T PSYCHIATRIC: unable to perform due to acutely comatose state  SKIN: No obvious rash, lesion, or ulcer.   Labs/imaging that I havepersonally reviewed  (right click and "Reselect all SmartList Selections" daily)     Labs   CBC: Recent Labs  Lab 07/26/2021 2033 07/16/21 0224 07/16/21 0448 07/16/21 0716 07/17/21 0321 07/17/21 1004 07/17/21 1556 07/17/21 2216 Jul 26, 2021 0320  WBC 12.1*  --  20.7*  --  15.1*  --   --   --  8.8  NEUTROABS 10.1*  --   --   --  13.1*  --   --   --   --   HGB 9.1*   < > 9.3*   < > 7.4* 9.1* 8.8* 9.3* 9.0*  HCT 27.2*   < > 27.5*   < > 21.7* 26.3* 26.0* 27.8* 27.2*  MCV 92.2  --  92.0  --  92.7  --   --   --  97.1  PLT 283   292  --  215  --  121*  --   --   --  92*   < > = values in this interval not displayed.     Basic Metabolic  Panel: Recent Labs  Lab 07/16/21 0448 07/16/21 0716 07/16/21 1530 07/16/21 1856 07/17/21 0321 07/17/21 1004 07/17/21 1556 07/17/21 2216 July 31, 2021 0320  NA 134* 136 142  --  134* 135 142 147* 148*  K 5.4* 5.1 4.5  --  4.1  --   --   --  4.5  CL 103 104 104  --  99  --   --   --  110  CO2 19* 18* 17*  --  26  --   --   --  26  GLUCOSE 318* 326* 251*  --  156*  --   --   --  109*  BUN 20 22 25*  --  29*  --   --   --  44*  CREATININE 0.96 0.80 1.11*  --  1.21*  --   --   --  2.36*  CALCIUM 7.6* 7.6* 7.8*  --  7.0*  --   --   --  6.3*  MG 1.8  --   --  2.3 2.0  --   --   --  2.0  PHOS  --   --   --  7.8* 5.0*  --   --   --  5.3*    GFR: Estimated Creatinine Clearance: 16.2 mL/min (A) (by C-G formula based on SCr of 2.36 mg/dL (H)). Recent Labs  Lab 07/22/2021 2033 07/25/2021 2249 07/16/21 0448  07/16/21 0716 07/16/21 1856 07/16/21 2259 07/17/21 0321 07/17/21 0456 07/17/21 1004 07/31/21 0320  WBC 12.1*  --  20.7*  --   --   --  15.1*  --   --  8.8  LATICACIDVEN  --    < > 4.7*   < > >9.0* 7.6*  --  5.2* 3.2*  --    < > = values in this interval not displayed.     Liver Function Tests: Recent Labs  Lab 07/16/21 1530 07/31/21 0320  AST 459* 1,086*  ALT 409* 329*  ALKPHOS 50 90  BILITOT 0.4 1.0  PROT <3.0* 3.9*  ALBUMIN 1.7* 2.9*    No results for input(s): LIPASE, AMYLASE in the last 168 hours. No results for input(s): AMMONIA in the last 168 hours.  ABG    Component Value Date/Time   PHART 7.36 07/17/2021 0349   PCO2ART 46 07/17/2021 0349   PO2ART PENDING 07/17/2021 0349   HCO3 26.0 07/17/2021 0349   ACIDBASEDEF 11.2 (H) 07/16/2021 1726   O2SAT 44.7 07/17/2021 0349     Coagulation Profile: Recent Labs  Lab 07/24/2021 2033  INR 1.4*     Cardiac Enzymes: No results for input(s): CKTOTAL, CKMB, CKMBINDEX, TROPONINI in the last 168 hours.  HbA1C: Hgb A1c MFr Bld  Date/Time Value Ref Range Status  07/16/2021 06:39 AM 7.7 (H) 4.8 - 5.6 % Final    Comment:    (NOTE) Pre diabetes:          5.7%-6.4%  Diabetes:              >6.4%  Glycemic control for   <7.0% adults with diabetes     CBG: Recent Labs  Lab 07/17/21 1918 07/17/21 2339 07/17/21 2353 July 31, 2021 0402 2021/07/31 0721  GLUCAP 78 44* 98 88 103*     Review of Systems:   UNABLE TO ASSESS DUE TO AMS  Past Medical History  She,  has a past medical history of Diabetes mellitus without complication ([place]).   Surgical History    Past Surgical History:  Procedure Laterality  Date   LOWER EXTREMITY ANGIOGRAPHY Right 01/11/2021   Procedure: LOWER EXTREMITY ANGIOGRAPHY;  Surgeon: Algernon Huxley, MD;  Location: [facility];  Service: Cardiovascular;  Laterality: Right;   LOWER EXTREMITY ANGIOGRAPHY Left 04/05/2021   Procedure: LOWER EXTREMITY ANGIOGRAPHY;  Surgeon: Algernon Huxley, MD;   Location: [facility];  Service: Cardiovascular;  Laterality: Left;   LOWER EXTREMITY ANGIOGRAPHY Left 07/25/2021   Procedure: Lower Extremity Angiography;  Surgeon: Algernon Huxley, MD;  Location: [facility];  Service: Cardiovascular;  Laterality: Left;     Social History   reports that she has quit smoking. Her smoking use included cigarettes. She smoked an average of .5 packs per day. She has never used smokeless tobacco. She reports that she does not drink alcohol.   Family History   Her family history includes Diabetes in her maternal aunt, maternal uncle, and mother; Heart attack in her brother; Lung cancer in her brother.   Allergies No Known Allergies   Home Medications  Prior to Admission medications   Medication Sig Start Date End Date Taking? Authorizing Provider  apixaban (ELIQUIS) 5 MG TABS tablet Take 1 tablet (5 mg total) by mouth 2 (two) times daily. 07/31/2021  Yes Algernon Huxley, MD  aspirin EC 81 MG tablet Take 1 tablet (81 mg total) by mouth daily. 01/11/21  Yes Dew, Erskine Squibb, MD  atorvastatin (LIPITOR) 10 MG tablet Take 1 tablet (10 mg total) by mouth daily. 01/11/21 01/11/22 Yes Dew, Erskine Squibb, MD  clopidogrel (PLAVIX) 75 MG tablet Take 1 tablet (75 mg total) by mouth daily. 01/11/21  Yes Dew, Erskine Squibb, MD  gabapentin (NEURONTIN) 300 MG capsule Take 1 capsule (300 mg total) by mouth at bedtime. 05/05/21  Yes Kris Hartmann, NP  insulin glargine (LANTUS) 100 UNIT/ML injection 7 units before breakfast Patient taking differently: Inject 7 Units into the skin. 7 units before breakfast 10/02/19 07/25/2021 Yes Triplett, Cari B, FNP  metFORMIN (GLUCOPHAGE) 500 MG tablet Take by mouth. 10/11/19  Yes [provider]  traZODone (DESYREL) 50 MG tablet Take 50 mg by mouth at bedtime. 11/20/20  Yes [provider]  atorvastatin (LIPITOR) 10 MG tablet Take 1 tablet by mouth daily. 01/11/21 01/11/22  [provider]  BD INSULIN SYRINGE U/F 31G X 5/16" 0.3 ML MISC See  admin instructions. 12/14/20   [provider]  glucose blood (PRECISION QID TEST) test strip 1 each (1 strip total) 3 (three) times daily To check blood sugars Dx: E11.9 11/24/20   [provider]  Scheduled Meds:  sodium chloride   Intravenous Once   sodium chloride   Intravenous Once   sodium chloride   Intravenous Once   atorvastatin  10 mg Per Tube QHS   chlorhexidine gluconate (MEDLINE KIT)  15 mL Mouth Rinse BID   Chlorhexidine Gluconate Cloth  6 each Topical Q0600   docusate  100 mg Per Tube BID   gabapentin  300 mg Oral QHS   insulin aspart  0-15 Units Subcutaneous Q4H   mouth rinse  15 mL Mouth Rinse 10 times per day   polyethylene glycol  17 g Per Tube Daily   sodium chloride flush  10-40 mL Intracatheter Q12H   sodium chloride flush  3 mL Intravenous Q12H   traZODone  50 mg Oral QHS   Continuous Infusions:  sodium chloride Stopped (07/16/21 1541)   sodium chloride Stopped (07/16/21 1541)   cefTRIAXone (ROCEPHIN)  IV Stopped (08-09-2021 8341)  dextrose 5% lactated ringers Stopped (08/14/21 0602)   fentaNYL infusion INTRAVENOUS Stopped (07/16/21 2241)   heparin 750 Units/hr (14-Aug-2021 0900)   norepinephrine (LEVOPHED) Adult infusion Stopped (07/17/21 1119)   phenylephrine (NEO-SYNEPHRINE) Adult infusion Stopped (07/17/21 1416)    sodium bicarbonate (isotonic) infusion in sterile water 100 mL/hr at 14-Aug-2021 0900   sodium chloride (hypertonic) 75 mL/hr at 08/14/21 0900   vasopressin Stopped (07/17/21 0709)   PRN Meds:.sodium chloride, acetaminophen, acetaminophen, fentaNYL, hydrALAZINE, midazolam, midazolam, ondansetron (ZOFRAN) IV, ondansetron (ZOFRAN) IV, sodium chloride flush, sodium chloride flush   Active Hospital Problem list     Assessment & Plan:   Acute Blood Loss Anemia Secondary to Right Groin Hematoma post sheath removal Hemorrhagic Shock-  -repeat CT abdomen 07/16/21 shows improvement in hematoma -h/h was >9 even after initial CT chest and  this was after IV fluid so it is difficult to say that shock was solely due to bleeding. She has very low EF on TTE and this may coupled with long standing DM is a high risk for cardiomyopathy and CAD -currently on PRVC -CT abdomen/Pelvis negative for obvious pseudoaneurysm or retroperitoneal hematoma, -IVF resuscitation and Vasopressors to maintain MAP>65 -H&H monitoring q6h -Obtain STAT CBC, CMP, DIC Panel, Lactate -Transfuse PRN Hgb<8 -NPO for now -Hold NSAIDs, steroids, ASA  Cardiac arrest s/p ACLS   Patient had ROSC post CPR    See procedure note for code blue   - patient had bedside TTE - LVEF 20%   -I met with family and discussed in details in [place] to son and daughter the findings and medical plan via interperter Rafael    - updated vascular surgery -   - reviewed plan with cardiology team - appreciate input  -  patient is in NSR now     Severe PAD with rest pain left lower extremity S/p Lower Extremity Angiography complicated by right groin Hematoma post sheath removal PMHx PAD with extensive BLE revascularization -Post direct proximal hemostatic pressure x 20 minutes -Hold Tirofiban (Aggrastat) -PRN pain management -Management per Vascular,discussed CT findings with vascular on call, per Dr. Lucky Cowboy no urgent need for intervention, continue to apply pressure with possible return to OR in the am.   Acute Encephalopathy multifactorial in the setting of above and underlying Dementia -Provide supportive care   Diabetes mellitus Diabetic Peripheral Neuropathy -CBGs -Sliding scale insulin -Follow ICU hyper/hypoglycemia protocol -Hold Gabapentin   Anxiety and Depression -Hold trazodone while NPO   Best practice:  Diet:  NPO Pain/Anxiety/Delirium protocol (if indicated): No VAP protocol (if indicated): Not indicated DVT prophylaxis: Contraindicated GI prophylaxis: PPI Glucose control:  SSI Yes Central venous access:  Yes, and it is still needed Arterial line:   N/A Foley:  Yes, and it is still needed Mobility:  bed rest  PT consulted: N/A Last date of multidisciplinary goals of care discussion [2/9] Code Status:  full code Disposition: ICU   = Goals of Care = Code Status Order: FULL  Primary Emergency Contact: Stjames,Ana updated patient's family at the bedside Wishes to pursue full aggressive treatment and intervention options, including CPR and intubation, but goals of care will be addressed on going with family if that should become necessary.   Critical care provider statement:   Total critical care time: 109 minutes   Performed by: Lanney Gins MD   Critical care time was exclusive of separately billable procedures and treating other patients.   Critical care was necessary to treat or prevent imminent or life-threatening deterioration.   Critical care  was time spent personally by me on the following activities: development of treatment plan with patient and/or surrogate as well as nursing, discussions with consultants, evaluation of patient's response to treatment, examination of patient, obtaining history from patient or surrogate, ordering and performing treatments and interventions, ordering and review of laboratory studies, ordering and review of radiographic studies, pulse oximetry and re-evaluation of patient's condition.    Ottie Glazier, M.D.  Pulmonary & Critical Care Medicine      .

## 2021-08-04 DEATH — deceased

## 2023-02-07 IMAGING — MR MR HEAD W/O CM
11 series · 48 of 48 positions shown · non-contrast
Comparison: Head CT 07/16/2021, head and neck CTA 07/17/2021, and
head MRI 04/02/2020

CLINICAL DATA: Stroke, follow-up. Cardiac arrest. Basilar tip
thrombosis.

EXAM:
MRI HEAD WITHOUT CONTRAST
TECHNIQUE: Multiplanar, multiecho pulse sequences of the brain and surrounding
structures were obtained without intravenous contrast.

[Series 5: ax dwi_tracew · axial · 3.0mm · 1.80mm/px · z∈[-27,+114]mm · 5 of 48 slices shown]
[im 1/48]
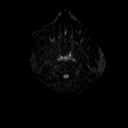
[im 12/48]
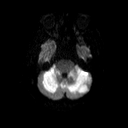
[im 24/48]
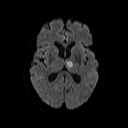
[im 36/48]
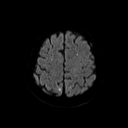
[im 48/48]
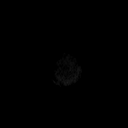

[Series 6: ax dwi_adc · axial · 3.0mm · 1.80mm/px · z∈[-27,+114]mm · 4 of 48 slices shown]
[im 1/48]
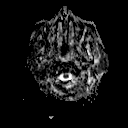
[im 16/48]
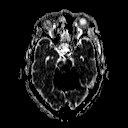
[im 32/48]
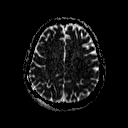
[im 48/48]
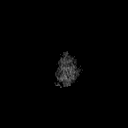

[Series 7: cor dwi_tracew · coronal · 5.0mm · 1.80mm/px · 3 of 38 slices shown]
[im 1/38]
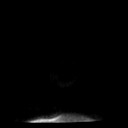
[im 19/38]
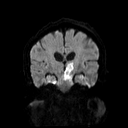
[im 38/38]
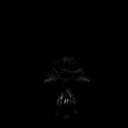

[Series 8: cor dwi_adc · coronal · 5.0mm · 1.80mm/px · 3 of 38 slices shown]
[im 1/38]
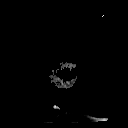
[im 19/38]
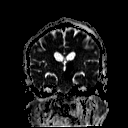
[im 38/38]
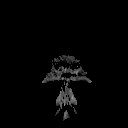

[Series 9: T1 · sagittal · 5.0mm · 0.62mm/px · 2 of 23 slices shown (1 of 2)]
[im 1/23]
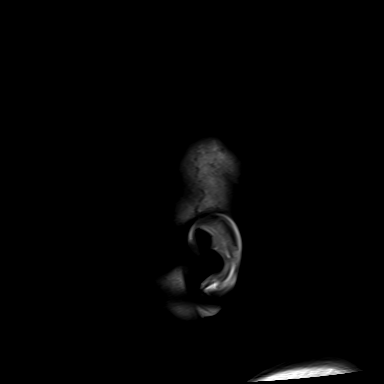
[im 23/23]
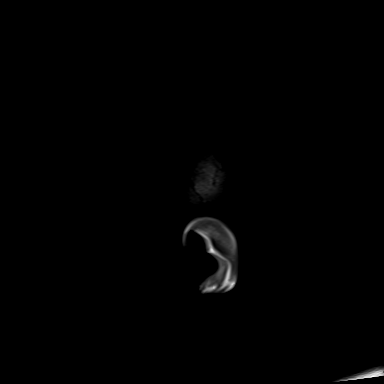

[Series 10: T2 · axial · 5.0mm · 0.86mm/px · z∈[-24,+107]mm · 2 of 25 slices shown (1 of 2)]
[im 1/25]
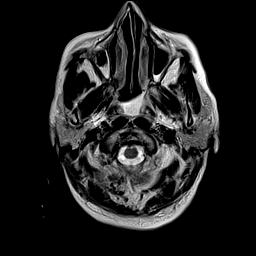
[im 25/25]
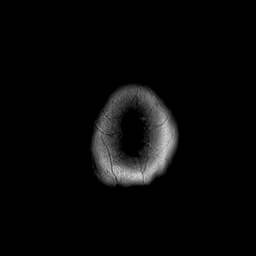

[Series 12: pha_images · axial · 3.0mm · 0.90mm/px · z∈[-27,+112]mm · 4 of 52 slices shown]
[im 1/52]
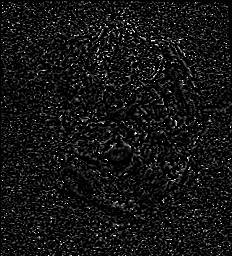
[im 18/52]
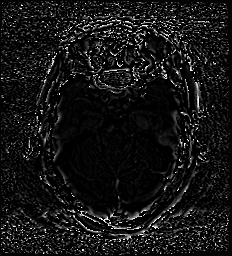
[im 35/52]
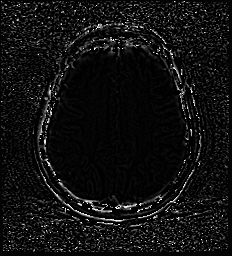
[im 52/52]
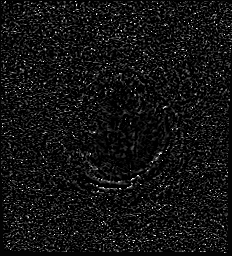

[Series 13: swi_images · axial · 3.0mm · 0.90mm/px · z∈[-27,+112]mm · 4 of 52 slices shown]
[im 1/52]
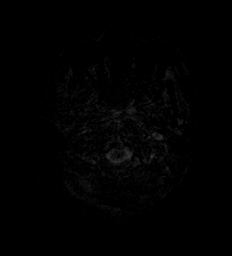
[im 18/52]
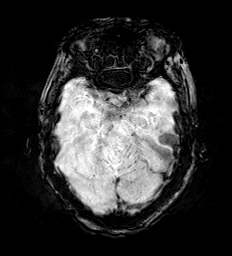
[im 35/52]
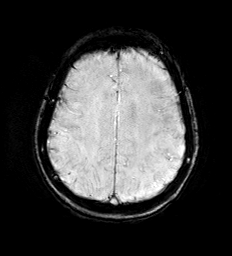
[im 52/52]
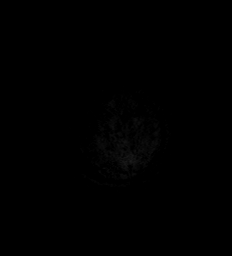

[Series 15: FLAIR · axial · 3.0mm · 0.69mm/px · z∈[-32,+115]mm · 5 of 55 slices shown]
[im 1/55]
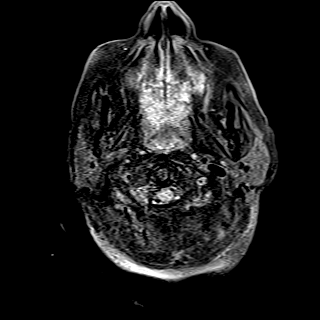
[im 14/55]
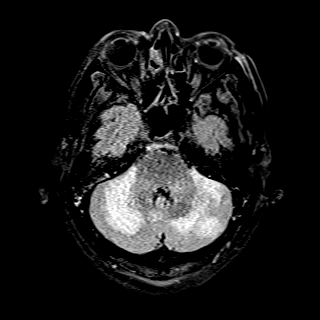
[im 28/55]
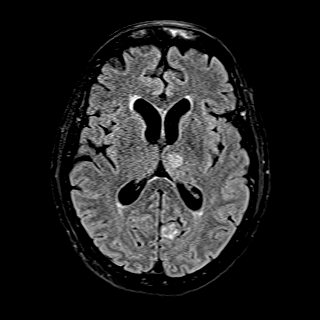
[im 41/55]
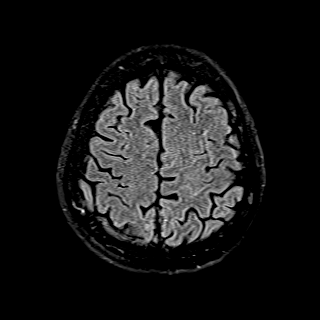
[im 55/55]
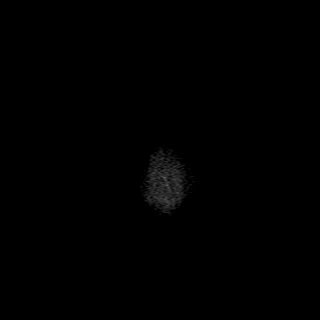

[Series 16: T1 · axial · 1.0mm · 0.98mm/px · z∈[-32,+127]mm · 14 of 176 slices shown (2 of 2)]
[im 1/176]
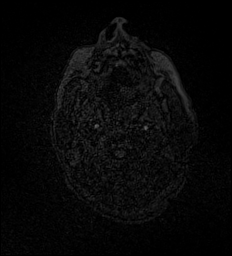
[im 14/176]
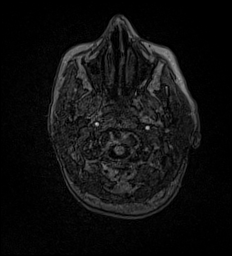
[im 27/176]
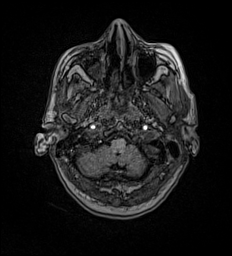
[im 41/176]
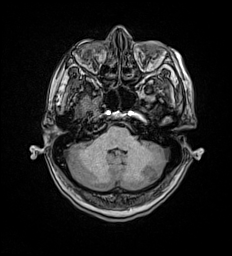
[im 54/176]
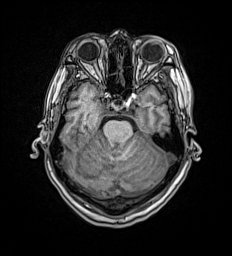
[im 68/176]
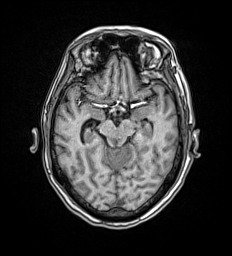
[im 81/176]
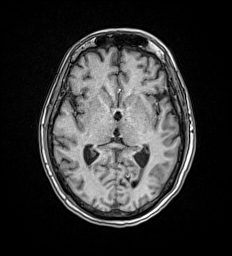
[im 95/176]
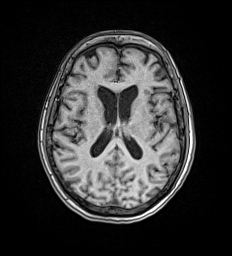
[im 108/176]
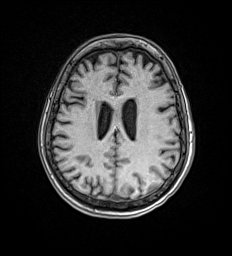
[im 122/176]
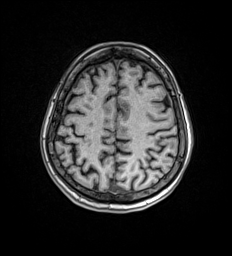
[im 135/176]
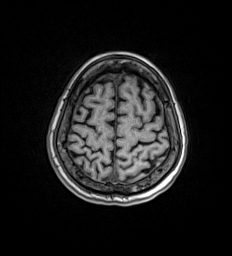
[im 149/176]
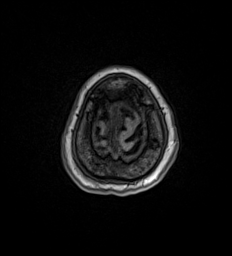
[im 162/176]
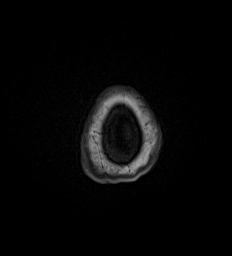
[im 176/176]
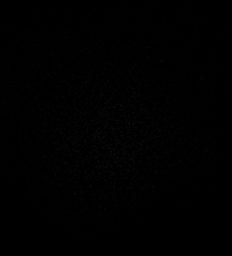

[Series 17: T2 · coronal · 5.0mm · 0.86mm/px · 2 of 29 slices shown (2 of 2)]
[im 1/29]
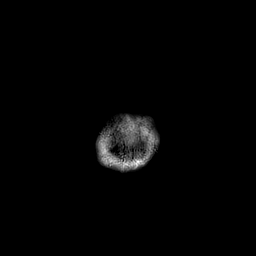
[im 29/29]
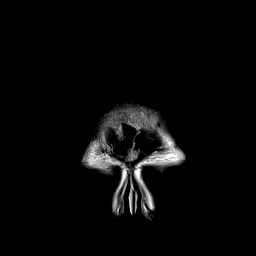

[48 of 48 positions shown; findings below may reference images not displayed]

FINDINGS: Brain: As seen on CT, there are large acute infarcts involving the
superior aspects of both cerebellar hemispheres. Acute infarcts are
also present in the pons, left greater than right midbrain, and left
thalamus. Additional punctate acute cortical and subcortical
infarcts are scattered throughout both cerebral hemispheres,
greatest posteriorly though with minor bilateral frontal lobe
involvement as well. There is associated bilateral cerebellar edema
and petechial hemorrhage with partial effacement of the basilar
cisterns and fourth ventricle. The lateral and third ventricles are
slightly more prominent than on yesterday CT.

There is a small chronic infarct with chronic blood products in the
medial left occipital lobe. Mild cerebral atrophy is within normal
limits for age. No mass, midline shift, or extra-axial fluid
collection is identified.

Vascular: Major intracranial vascular flow voids are preserved.

Skull and upper cervical spine: Unremarkable bone marrow signal para

Sinuses/Orbits: Unremarkable orbits. Subtotal opacification of the
right maxillary sinus by fluid. Mild mucosal thickening in the other
paranasal sinuses. Small right mastoid effusion.

Other: None.
IMPRESSION: 1. Large acute bilateral cerebellar infarcts with edema and
petechial hemorrhage. Partial effacement of the basilar cisterns and
fourth ventricle with possible early hydrocephalus.
2. Additional acute infarcts in the brainstem, left thalamus, and
scattered peripherally throughout both cerebral hemispheres.

## 2023-02-07 IMAGING — CT CT ANGIO HEAD-NECK (W OR W/O PERF)
3 of 8 series · 7 of 34 positions shown · IV contrast (APPLIED)
Comparison: Comparison made with prior head CT from 07/16/2021.

CLINICAL DATA: Initial evaluation for acute stroke.

EXAM:
CT ANGIOGRAPHY HEAD AND NECK
TECHNIQUE: Multidetector CT imaging of the head and neck was performed using
the standard protocol during bolus administration of intravenous
contrast. Multiplanar CT image reconstructions and MIPs were
obtained to evaluate the vascular anatomy. Carotid stenosis
measurements (when applicable) are obtained utilizing NASCET
criteria, using the distal internal carotid diameter as the
denominator.

[Series 7: ax thin · axial · 0.56mm/px · z∈[-297,-117]mm · 3 of 360 slices shown]
[im 90/360  soft-tissue]
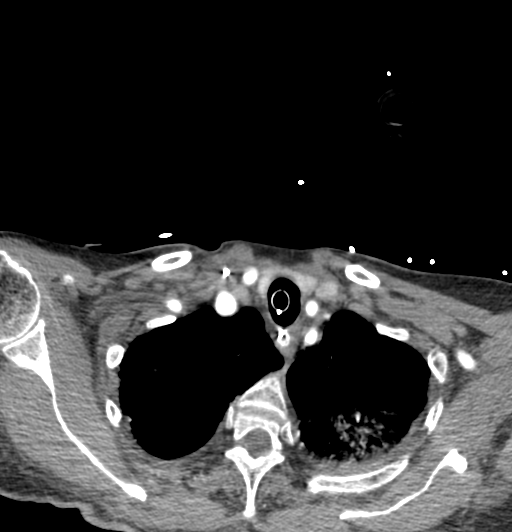
[im 180/360  bone]
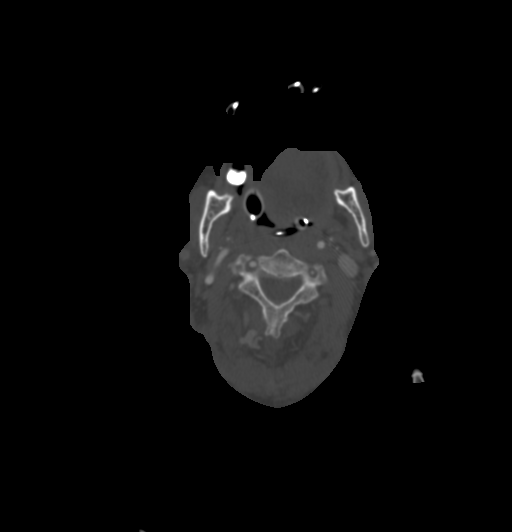
[im 270/360  soft-tissue]
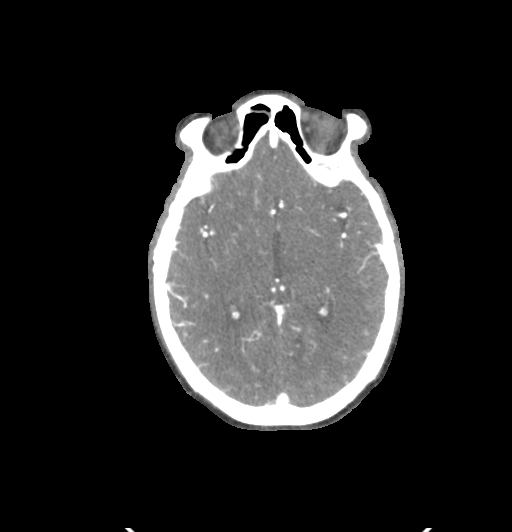

[Series 9: sagittal thin · sagittal · 0.58mm/px · 1 of 196 slices shown]
[im 49/196  soft-tissue]
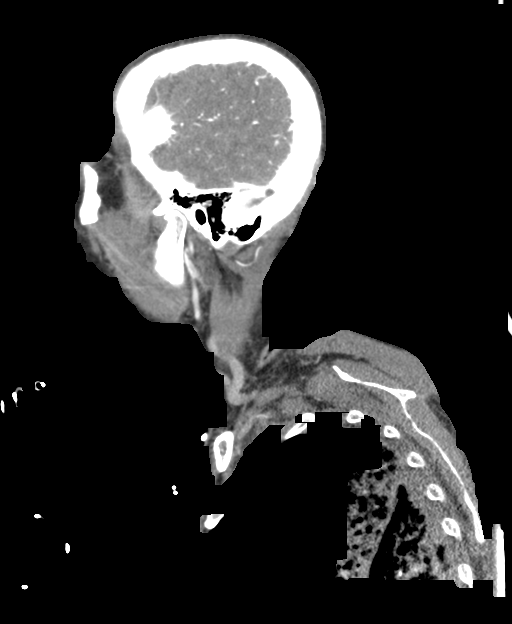

[Series 13: vpct perfusion · axial · 0.41mm/px · z∈[-154,-114]mm · 3 of 320 slices shown]
[im 80/320  soft-tissue]
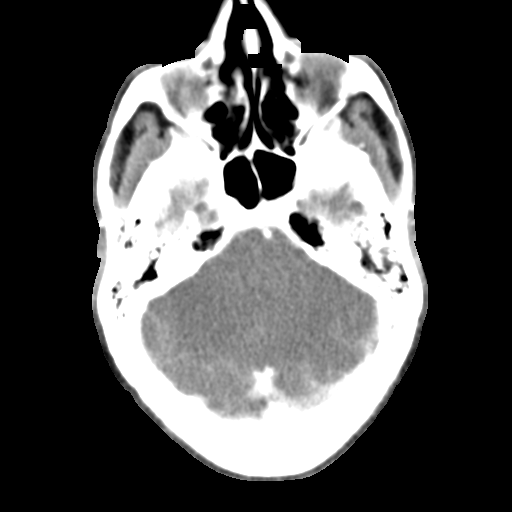
[im 160/320  soft-tissue]
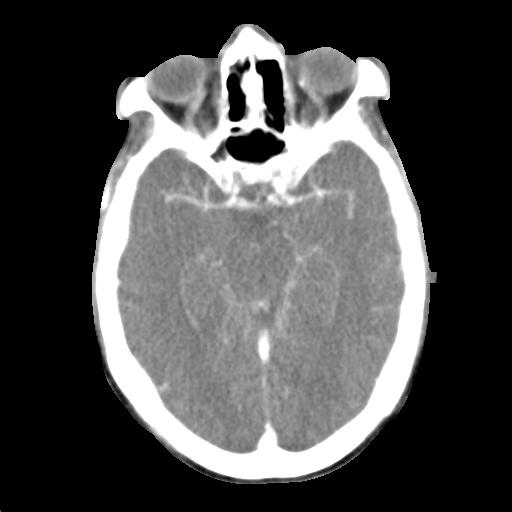
[im 240/320  soft-tissue]
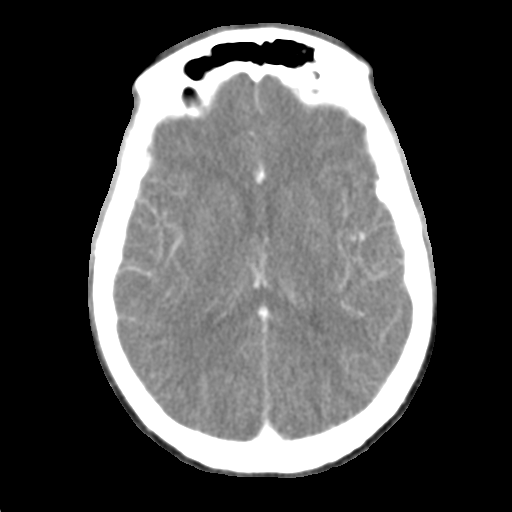

[7 of 34 positions shown; findings below may reference images not displayed]

RADIATION DOSE REDUCTION: This exam was performed according to the
departmental dose-optimization program which includes automated
exposure control, adjustment of the mA and/or kV according to
patient size and/or use of iterative reconstruction technique.

CONTRAST:  75mL OMNIPAQUE IOHEXOL 350 MG/ML SOLN
FINDINGS: CTA NECK FINDINGS

Aortic arch: Visualized aortic arch normal in caliber with normal 3
vessel morphology. Moderate atheromatous change about the origin of
the great vessels without high-grade stenosis.

Right carotid system: Right CCA patent from its origin to the
bifurcation without significant stenosis. Mixed plaque about the
right carotid bulb/proximal right ICA with associated stenosis of up
to 50% by NASCET criteria. Right ICA patent distally without
stenosis or dissection.

Left carotid system: Left CCA patent to the bifurcation without
significant stenosis. Eccentric calcified plaque about the left
carotid bulb/proximal left ICA without hemodynamically significant
stenosis. Left ICA patent distally without stenosis or dissection.

Vertebral arteries: Both vertebral arteries arise from the
subclavian arteries. No significant proximal subclavian artery
stenosis. Right vertebral artery strongly dominant with a diffusely
hypoplastic left vertebral artery. Atheromatous change at the origin
of the dominant right vertebral artery with moderate ostial
stenosis. Vertebral arteries otherwise patent distally without
stenosis or dissection.

Skeleton: No discrete or worrisome osseous lesions. Mild for age
cervical spondylosis.

Other neck: Endotracheal and enteric tubes in place. The enteric
tube is partially coiled within the oral cavity before coursing
inferiorly. 1 right-sided central venous catheter in place. No other
acute soft tissue abnormality within the neck.

Upper chest: Multifocal pneumonia noted within the partially
visualized lungs. Layering bilateral pleural effusions partially
visualized as well.

Review of the MIP images confirms the above findings

CTA HEAD FINDINGS

Anterior circulation: Petrous segments patent bilaterally.
Atheromatous change within the carotid siphons with associated mild
multifocal narrowing. 3 mm left paraophthalmic aneurysm noted
(series 7, image 263). A1 segments patent bilaterally. Normal
anterior communicating artery complex. Anterior cerebral arteries
patent to their distal aspects without stenosis. No M1 stenosis or
occlusion. Normal MCA bifurcations. Distal MCA branches perfused and
symmetric.

Posterior circulation: Both V4 segments patent without stenosis.
Right PICA patent. Left PICA not well seen. Basilar patent
proximally. There is partially occlusive thrombus at the basilar tip
(series 8, image 135). Superior cerebellar arteries remain patent
distally. Both PCAs appear partially occluded at their origins, but
remain patent and well perfused distally as well.

Venous sinuses: Patent allowing for timing the contrast bolus.

Anatomic variants: None significant. Evolving superior cerebellar
artery territory infarcts again noted.

Review of the MIP images confirms the above findings
IMPRESSION: 1. Acute basilar tip thrombosis as detailed above.
2. Right vertebral artery dominant, with moderate stenosis at its
origin.
3. Atheromatous change about the right carotid bulb/proximal right
ICA with associated stenosis of up to 50% by NASCET criteria.
4. 3 mm left paraophthalmic aneurysm.
5. Multifocal pneumonia with layering bilateral pleural effusions,
partially visualized.

Critical Value/emergent results were called by telephone at the time
of interpretation on 07/17/2021 at [DATE] to provider Dr.
Kennardo, who verbally acknowledged these results.
